# Patient Record
Sex: Male | Born: 1935 | Race: White | Hispanic: No | Smoking: Never smoker
Health system: Southern US, Community
[De-identification: ages and names within clinical notes are randomized; demographics above are authoritative.]

## PROBLEM LIST (undated history)

## (undated) DIAGNOSIS — H353130 Nonexudative age-related macular degeneration, bilateral, stage unspecified: Secondary | ICD-10-CM

## (undated) DIAGNOSIS — E119 Type 2 diabetes mellitus without complications: Secondary | ICD-10-CM

## (undated) DIAGNOSIS — I10 Essential (primary) hypertension: Secondary | ICD-10-CM

## (undated) DIAGNOSIS — F0390 Unspecified dementia without behavioral disturbance: Secondary | ICD-10-CM

## (undated) DIAGNOSIS — C61 Malignant neoplasm of prostate: Secondary | ICD-10-CM

## (undated) HISTORY — PX: JOINT REPLACEMENT: SHX530

## (undated) HISTORY — PX: CATARACT EXTRACTION W/ INTRAOCULAR LENS  IMPLANT, BILATERAL: SHX1307

---

## 1989-07-31 HISTORY — PX: TOTAL SHOULDER ARTHROPLASTY: SHX126

## 1998-09-25 ENCOUNTER — Ambulatory Visit: Admission: RE | Admit: 1998-09-25 | Discharge: 1998-09-25 | Payer: Self-pay | Admitting: Orthopedic Surgery

## 2001-05-26 ENCOUNTER — Other Ambulatory Visit: Admission: RE | Admit: 2001-05-26 | Discharge: 2001-05-26 | Payer: Self-pay | Admitting: Urology

## 2001-05-26 ENCOUNTER — Encounter (INDEPENDENT_AMBULATORY_CARE_PROVIDER_SITE_OTHER): Payer: Self-pay | Admitting: Specialist

## 2001-06-06 ENCOUNTER — Ambulatory Visit: Admission: RE | Admit: 2001-06-06 | Discharge: 2001-09-04 | Payer: Self-pay | Admitting: *Deleted

## 2001-06-08 ENCOUNTER — Encounter: Admission: RE | Admit: 2001-06-08 | Discharge: 2001-06-08 | Payer: Self-pay | Admitting: Urology

## 2001-06-08 ENCOUNTER — Encounter: Payer: Self-pay | Admitting: Urology

## 2003-03-20 ENCOUNTER — Ambulatory Visit (HOSPITAL_BASED_OUTPATIENT_CLINIC_OR_DEPARTMENT_OTHER): Admission: RE | Admit: 2003-03-20 | Discharge: 2003-03-20 | Payer: Self-pay | Admitting: Urology

## 2003-03-20 ENCOUNTER — Encounter (INDEPENDENT_AMBULATORY_CARE_PROVIDER_SITE_OTHER): Payer: Self-pay

## 2009-04-10 ENCOUNTER — Encounter (INDEPENDENT_AMBULATORY_CARE_PROVIDER_SITE_OTHER): Payer: Self-pay | Admitting: General Surgery

## 2009-04-10 ENCOUNTER — Ambulatory Visit (HOSPITAL_COMMUNITY): Admission: RE | Admit: 2009-04-10 | Discharge: 2009-04-11 | Payer: Self-pay | Admitting: General Surgery

## 2011-03-10 LAB — COMPREHENSIVE METABOLIC PANEL
ALT: 18 U/L (ref 0–53)
AST: 24 U/L (ref 0–37)
Albumin: 3.8 g/dL (ref 3.5–5.2)
Alkaline Phosphatase: 59 U/L (ref 39–117)
BUN: 18 mg/dL (ref 6–23)
CO2: 30 mEq/L (ref 19–32)
Calcium: 9.3 mg/dL (ref 8.4–10.5)
Chloride: 103 mEq/L (ref 96–112)
Creatinine, Ser: 1.09 mg/dL (ref 0.4–1.5)
GFR calc Af Amer: >60 mL/min (ref >60)
GFR calc non Af Amer: >60 mL/min (ref >60)
Glucose, Bld: 123 mg/dL — ABNORMAL HIGH (ref 70–99)
Potassium: 3.3 mEq/L — ABNORMAL LOW (ref 3.5–5.1)
Sodium: 139 mEq/L (ref 135–145)
Total Bilirubin: 0.8 mg/dL (ref 0.3–1.2)
Total Protein: 6.3 g/dL (ref 6.0–8.3)

## 2011-03-10 LAB — DIFFERENTIAL
Basophils Absolute: 0 10*3/uL (ref 0.0–0.1)
Basophils Relative: 0 % (ref 0–1)
Eosinophils Absolute: 0.1 10*3/uL (ref 0.0–0.7)
Eosinophils Relative: 1 % (ref 0–5)
Lymphocytes Relative: 17 % (ref 12–46)
Lymphs Abs: 1.6 10*3/uL (ref 0.7–4.0)
Monocytes Absolute: 0.5 10*3/uL (ref 0.1–1.0)
Monocytes Relative: 6 % (ref 3–12)
Neutro Abs: 7 10*3/uL (ref 1.7–7.7)
Neutrophils Relative %: 76 % (ref 43–77)

## 2011-03-10 LAB — CBC
HCT: 40.9 % (ref 39.0–52.0)
Hemoglobin: 14.4 g/dL (ref 13.0–17.0)
MCHC: 35.2 g/dL (ref 30.0–36.0)
MCV: 92.2 fL (ref 78.0–100.0)
Platelets: 139 10*3/uL — ABNORMAL LOW (ref 150–400)
RBC: 4.44 MIL/uL (ref 4.22–5.81)
RDW: 13.6 % (ref 11.5–15.5)
WBC: 9.3 10*3/uL (ref 4.0–10.5)

## 2011-04-14 NOTE — Op Note (Signed)
NAME:  Fred Morris, Fred Morris                  ACCOUNT NO.:  [id number]   MEDICAL RECORD NO.:  [id number]          PATIENT TYPE:  AMB   LOCATION:  DAY                          FACILITY:  [facility]   PHYSICIAN:  Anselm Pancoast. Weatherly, M.D.DATE OF BIRTH:  Jul 17, 1918   DATE OF PROCEDURE:  04/10/2009  DATE OF DISCHARGE:                               OPERATIVE REPORT   PREOPERATIVE DIAGNOSIS:  Right inguinal hernia   OPERATION:  Right inguinal herniorrhaphy with mesh.   ANESTHESIA:  LOA with local sedation.   SURGEON:  Anselm Pancoast. Zachery Dakins, MD.   ASSISTANT:  Nurse.   HISTORY:  Fred Morris is a 77 year old Caucasian male, , who I first saw  him back in January when he was referred by Dr. Jacky Kindle for a mildly  symptomatic right inguinal hernia.  The patient, because of his age 77 and  etc., did not definitely want to proceed with herniorrhaphy.  He was  interested in getting a truss, which he did, and he has had a previous  problem of cancer of the prostate with radiation seeds, but he is not  having any definite problems with voiding.  He does have nocturia 2 or 3  times at night, but says his stream comes easily.  I saw him about 3  months later at which time his hernia was significantly larger and he  said that the truss had not worked and he wanted to proceed on with a  herniorrhaphy.  I talked with Dr. Jacky Kindle, his medical doctor, and he  said that the patient had a flex-sigmoid years ago, but that he had  never had any blood in his stool and I did not find any either, and he  thought age 77 that it was not necessary to do any preoperative  evaluation except for a chest x-ray and EKG, which he has had recently.  So he is here for the planned herniorrhaphy and we will plan on placing  a mesh.  The patient was taken to the operative suite.  Dr. Rica Mast  recommended that we do an LOA instead of trying to do it with local and  sedation, and I thought that would be fine.  An LOA tube was placed, the  groin  was clipped, and then prepped with Betadine solution.  He had a  gram of Ancef preoperatively.  His prep, I think, was the Hibiclens  since they questioned whether he might be allergic to iodine, and the  patient was draped in a sterile manner.  An ilioinguinal nerve block was  placed with about 10 mL of 0.25 Marcaine with adrenaline with a blunted  22-inch needle at the anterior iliac crest, and then the inguinal  incision area was infiltrated with the same solution using a 25-gauge  needle.  Sharp dissection down through the skin and subcutaneous tissue.  The timeout had been completed, the patient had been marked  preoperatively on the right side, and these 2 superficial veins were  clamped, divided, and ligated with a 5-0 Vicryl, and then the external  oblique aponeurosis was opened through the external ring.  The cord  structures were elevated.  It looks like this was a fairly large  indirect hernia.  He has got a weakness in the floor, but the actual  bulge is an indirect hernia, and I elevated the cord structures, kind of  separated the cremaster fibers and etc., and then opened the cord  structures and dissected out a fairly broad-based hernia sac that was  about 4 or 5 inches in length.  A high sacral ligation under direct  vision was done with 0 Surgilon and the second suture just distal.  We  had dissected out the vas and artery and care not to compromise any of  the veins.  I anesthetized the peritoneum then before removing the  hernia sac and then retracted up the preperitoneal space nicely and then  kind of reapproximated the cremaster fibers with some interrupted 3-0  Vicryl.  He has got a definite weakness in the floor but not really a  definite hernia, and I elected to repair this with a kind of a modified  Shouldice starting at the symphysis pubis, sutured the lateral edge of  the conjoin to the inguinal ligament and running the second layer back,  tying the 2 tails each  time and the 2 ends together.  Next, a piece of  Prolene mesh shaped like a sail slit laterally was used to reinforce the  floor.  The ilioinguinal nerve is with the cord structures and I sutured  this to the shelving over the inguinal ligament inferior with a running  2-0 Prolene.  The 2 tails were sutured together lateral and making sure  that we did not compromise the new created internal ring by checking the  mesh and opening it.  The superior flap was sutured down with  interrupted sutures of 2-0 Prolene and the mesh is lying flat but not  tight.  The Penrose was removed.  Then, the external oblique was closed  with a running 2-0 Vicryl.  Scarpa's fascia was closed with interrupted  3-0 Vicryl, and then a 4-0 Dexon subcuticular with Benzoin and Steri-  Strips on the skin.  The testicle was in its normal position.  The  patient awakened and was sent to the recovery room in stable postop  condition.  We are going to keep him overnight and hopefully he will  able to be released in the morning and hopefully he will not have to  require catheterization.      Anselm Pancoast. Zachery Dakins, M.D.  Electronically Signed     WJW/MEDQ  D:  04/10/2009  T:  04/10/2009  Job:  [id number]   cc:   [name], M.D.  Fax: [phone]

## 2011-04-17 NOTE — Op Note (Signed)
NAME:  Fred Morris, Fred Morris                            ACCOUNT NO.:  [id number]   MEDICAL RECORD NO.:  [id number]                   PATIENT TYPE:  AMB   LOCATION:  [facility]                                 FACILITY:  [facility]   PHYSICIAN:  Jamison Neighbor, M.D.               DATE OF BIRTH:  1918-09-16   DATE OF PROCEDURE:  03/20/2003  DATE OF DISCHARGE:                                 OPERATIVE REPORT   SERVICE:  Urology.   PREOPERATIVE DIAGNOSES:  Bladder mass, rule out transitional cell carcinoma  of the bladder.   POSTOPERATIVE DIAGNOSES:  Bladder mass, rule out transitional cell carcinoma  of the bladder.   PROCEDURE:  Cystoscopy and TURBT.   SURGEON:  Jamison Neighbor, M.D.   ANESTHESIA:  General.   COMPLICATIONS:  None.   DRAINS:  None.   BRIEF HISTORY:  This 77 year old male is status post radiation therapy for  carcinoma of the prostate. The patient has developed gross hematuria. His  upper tract studies were unremarkable. Cystoscopy demonstrated an  inflammatory appearing lesion on the trigone of the right hand side. The  patient is now to undergo transurethral resection of this mass for biopsy  purposes as well as to control bleeding. The patient understands the risks  and benefits of the procedure and gave full and informed consent.   DESCRIPTION OF PROCEDURE:  After successful induction of general anesthesia,  the patient was placed in the dorsal lithotomy position, prepped with  Betadine and draped in the usual sterile fashion. Cystoscopy was performed,  the urethra was visualized in its entirety and found to be normal. Beyond  the verumontanum, there was some prostatic enlargement and the prostate was  noted to be a little bit rigid following the radiation therapy. The left  side of the prostate appeared to be somewhat larger than the right. The  cystoscope was inserted into the bladder, the bladder is markedly  trabeculated but the patient does not have significant  bladder outlet  symptoms at this time and for that reason it was felt that TURP is not  appropriate. The lesion in question was identified on the right hand side of  the trigone, left hand side appeared normal. Because of the lesion which is  very inflammatory in nature as well as the trabeculation, it was difficult  to ascertain the location of the ureters. The cystoscope was removed, the  resectoscope was inserted, the area was resected and fulgurated in order  ensure that hemostasis had been obtained. The chips were taken from the  bladder and will be sent for permanent specimen. The patient tolerated the  procedure well and was taken to the recovery room in good condition. The  patient will go home without a catheter unless he has difficulty voiding.  The patient tolerated the procedure well and was taken to the recovery room  in good condition to be sent  home on Septra DS and Darvocet-N 100 and return  to see me in 1-2 weeks time.                                              Jamison Neighbor, M.D.   RJE/MEDQ  D:  03/20/2003  T:  03/20/2003  Job:  [id number]

## 2011-05-07 ENCOUNTER — Other Ambulatory Visit: Payer: Self-pay | Admitting: Dermatology

## 2012-03-17 ENCOUNTER — Other Ambulatory Visit: Payer: Self-pay | Admitting: Dermatology

## 2012-09-21 ENCOUNTER — Other Ambulatory Visit: Payer: Self-pay | Admitting: Dermatology

## 2013-08-16 ENCOUNTER — Other Ambulatory Visit: Payer: Self-pay | Admitting: Dermatology

## 2013-12-14 ENCOUNTER — Other Ambulatory Visit: Payer: Self-pay | Admitting: Dermatology

## 2013-12-29 ENCOUNTER — Other Ambulatory Visit: Payer: Self-pay | Admitting: Dermatology

## 2014-04-20 ENCOUNTER — Other Ambulatory Visit: Payer: Self-pay | Admitting: Dermatology

## 2014-10-17 ENCOUNTER — Other Ambulatory Visit: Payer: Self-pay | Admitting: Dermatology

## 2015-02-08 ENCOUNTER — Encounter (HOSPITAL_COMMUNITY): Payer: Self-pay

## 2015-02-08 ENCOUNTER — Inpatient Hospital Stay (HOSPITAL_COMMUNITY)
Admission: EM | Admit: 2015-02-08 | Discharge: 2015-02-10 | DRG: 310 | Disposition: A | Discharge status: Home Health | Payer: Medicare Other | Attending: Internal Medicine | Admitting: Internal Medicine

## 2015-02-08 ENCOUNTER — Emergency Department (HOSPITAL_COMMUNITY): Payer: Medicare Other

## 2015-02-08 DIAGNOSIS — I10 Essential (primary) hypertension: Secondary | ICD-10-CM | POA: Diagnosis present

## 2015-02-08 DIAGNOSIS — H3530 Unspecified macular degeneration: Secondary | ICD-10-CM | POA: Diagnosis present

## 2015-02-08 DIAGNOSIS — I4891 Unspecified atrial fibrillation: Principal | ICD-10-CM | POA: Insufficient documentation

## 2015-02-08 DIAGNOSIS — F329 Major depressive disorder, single episode, unspecified: Secondary | ICD-10-CM | POA: Diagnosis present

## 2015-02-08 DIAGNOSIS — E119 Type 2 diabetes mellitus without complications: Secondary | ICD-10-CM | POA: Diagnosis present

## 2015-02-08 DIAGNOSIS — R55 Syncope and collapse: Secondary | ICD-10-CM | POA: Diagnosis present

## 2015-02-08 DIAGNOSIS — Z8546 Personal history of malignant neoplasm of prostate: Secondary | ICD-10-CM | POA: Diagnosis not present

## 2015-02-08 DIAGNOSIS — Z923 Personal history of irradiation: Secondary | ICD-10-CM

## 2015-02-08 DIAGNOSIS — F0390 Unspecified dementia without behavioral disturbance: Secondary | ICD-10-CM | POA: Diagnosis present

## 2015-02-08 DIAGNOSIS — R109 Unspecified abdominal pain: Secondary | ICD-10-CM

## 2015-02-08 HISTORY — DX: Malignant neoplasm of prostate: C61

## 2015-02-08 HISTORY — DX: Unspecified dementia, unspecified severity, without behavioral disturbance, psychotic disturbance, mood disturbance, and anxiety: F03.90

## 2015-02-08 HISTORY — DX: Essential (primary) hypertension: I10

## 2015-02-08 HISTORY — DX: Type 2 diabetes mellitus without complications: E11.9

## 2015-02-08 HISTORY — DX: Nonexudative age-related macular degeneration, bilateral, stage unspecified: H35.3130

## 2015-02-08 LAB — I-STAT TROPONIN, ED: Troponin i, poc: 0 ng/mL (ref 0.00–0.08)

## 2015-02-08 LAB — BASIC METABOLIC PANEL
ANION GAP: 7 (ref 5–15)
BUN: 23 mg/dL (ref 6–23)
CHLORIDE: 105 mmol/L (ref 96–112)
CO2: 27 mmol/L (ref 19–32)
Calcium: 9.1 mg/dL (ref 8.4–10.5)
Creatinine, Ser: 0.99 mg/dL (ref 0.50–1.35)
GFR calc Af Amer: 79 mL/min — ABNORMAL LOW (ref >90)
GFR calc non Af Amer: 68 mL/min — ABNORMAL LOW (ref >90)
Glucose, Bld: 119 mg/dL — ABNORMAL HIGH (ref 70–99)
Potassium: 3.5 mmol/L (ref 3.5–5.1)
SODIUM: 139 mmol/L (ref 135–145)

## 2015-02-08 LAB — URINALYSIS, ROUTINE W REFLEX MICROSCOPIC
BILIRUBIN URINE: NEGATIVE
GLUCOSE, UA: NEGATIVE mg/dL
HGB URINE DIPSTICK: NEGATIVE
Ketones, ur: 15 mg/dL — AB
Leukocytes, UA: NEGATIVE
Nitrite: NEGATIVE
PH: 6 (ref 5.0–8.0)
PROTEIN: NEGATIVE mg/dL
Specific Gravity, Urine: 1.018 (ref 1.005–1.030)
Urobilinogen, UA: 0.2 mg/dL (ref 0.0–1.0)

## 2015-02-08 LAB — I-STAT CHEM 8, ED
BUN: 26 mg/dL — AB (ref 6–23)
CHLORIDE: 102 mmol/L (ref 96–112)
Calcium, Ion: 1.19 mmol/L (ref 1.13–1.30)
Creatinine, Ser: 0.9 mg/dL (ref 0.50–1.35)
GLUCOSE: 117 mg/dL — AB (ref 70–99)
HCT: 43 % (ref 39.0–52.0)
HEMOGLOBIN: 14.6 g/dL (ref 13.0–17.0)
Potassium: 3.4 mmol/L — ABNORMAL LOW (ref 3.5–5.1)
Sodium: 141 mmol/L (ref 135–145)
TCO2: 22 mmol/L (ref 0–100)

## 2015-02-08 LAB — CBC
HCT: 40.7 % (ref 39.0–52.0)
Hemoglobin: 13.7 g/dL (ref 13.0–17.0)
MCH: 30.8 pg (ref 26.0–34.0)
MCHC: 33.7 g/dL (ref 30.0–36.0)
MCV: 91.5 fL (ref 78.0–100.0)
Platelets: 133 10*3/uL — ABNORMAL LOW (ref 150–400)
RBC: 4.45 MIL/uL (ref 4.22–5.81)
RDW: 13.6 % (ref 11.5–15.5)
WBC: 9.5 10*3/uL (ref 4.0–10.5)

## 2015-02-08 LAB — CK: Total CK: 165 U/L (ref 7–232)

## 2015-02-08 NOTE — ED Notes (Signed)
Pt presented to ED with his daughter. He stated that while eating breakfast,while walking to the bathroom he got dizzy and suffered LOC. Following this he reports feeling week "all over" and crawled into his den where he reports he sat for "a few hours". He reports hitting his head on the right side and his abdomen on his left side. He has a history of macular degeneration, HTN. He denies pain at this time. His extremities are equally strong; he has strong grips and equal dexterity.

## 2015-02-08 NOTE — ED Notes (Signed)
PT reports falling at home. Pt sts "I think I may have passed out for a good lil bit".  Bruising to left flank area and abrasion.  Pt also reports hitting head to the right temple.

## 2015-02-08 NOTE — ED Provider Notes (Signed)
CSN: [id number]     Arrival date & time 02/08/15  1739 History   First MD Initiated Contact with Patient 02/08/15 2035     Chief Complaint  Patient presents with  . Loss of Consciousness  . Fall   (Consider location/radiation/quality/duration/timing/severity/associated sxs/prior Treatment) HPI  Fred Morris is a 81 yo male presenting with report of syncopal episode and fall this morning.  He states woke up to start his day and had breakfast.  After breakfast he was walking to the bathroom, and suddenly felt light headed and thinks he passed out.  He thinks he laid in floor for a while and then crawled to another room and called his sister. His daughter she reports she called him around 10 am and he had not fallen at that time. The pt's sister called his daughter around 1 pm to report the fall.  She reports he had a fall earlier in the week where he fell and hit his left posterior ribs.  He currently denies any pain. He denies any recent illness including nausea, vomiting, diarrhea, fever.  He denies any chest pain or shortness of breath prior to the episode.   Past Medical History  Diagnosis Date  . Hypertension    History reviewed. No pertinent past surgical history. History reviewed. No pertinent family history. History  Substance Use Topics  . Smoking status: Not on file  . Smokeless tobacco: Not on file  . Alcohol Use: Not on file    Review of Systems  Constitutional: Negative for fever and chills.  HENT: Negative for sore throat.   Eyes: Negative for visual disturbance.  Respiratory: Negative for cough and shortness of breath.   Cardiovascular: Negative for chest pain and leg swelling.  Gastrointestinal: Negative for nausea, vomiting and diarrhea.  Genitourinary: Negative for dysuria.  Musculoskeletal: Negative for myalgias.  Skin: Negative for rash.  Neurological: Positive for syncope. Negative for weakness, numbness and headaches.      Allergies  Review of patient's  allergies indicates not on file.  Home Medications   Prior to Admission medications   Not on File   BP 146/84 mmHg  Pulse 76  Temp(Src) 98.1 F (36.7 C) (Oral)  Resp 16  SpO2 100% Physical Exam  Constitutional: He is oriented to person, place, and time. He appears well-developed and well-nourished. No distress.  HENT:  Head: Normocephalic and atraumatic.  Mouth/Throat: Oropharynx is clear and moist. No oropharyngeal exudate.  Eyes: Conjunctivae are normal.  Neck: Neck supple. No thyromegaly present.  Cardiovascular: Normal rate and intact distal pulses.  An irregular rhythm present. Exam reveals no gallop and no friction rub.   No murmur heard. Pulmonary/Chest: Effort normal and breath sounds normal. No respiratory distress. He has no wheezes. He has no rales. He exhibits no tenderness.  Abdominal: Soft. There is no tenderness.  Musculoskeletal: He exhibits no tenderness.       Arms: Mild bruising over left flank and healed 1 cm lac over left posterior ribs.  Lymphadenopathy:    He has no cervical adenopathy.  Neurological: He is alert and oriented to person, place, and time. No cranial nerve deficit. Coordination normal.  Skin: Skin is warm and dry. No rash noted. He is not diaphoretic.  Psychiatric: He has a normal mood and affect.  Nursing note and vitals reviewed.   ED Course  Procedures (including critical care time) Labs Review Labs Reviewed  CBC - Abnormal; Notable for the following:    Platelets 133 (*)  All other components within normal limits  BASIC METABOLIC PANEL - Abnormal; Notable for the following:    Glucose, Bld 119 (*)    GFR calc non Af Amer 68 (*)    GFR calc Af Amer 79 (*)    All other components within normal limits  I-STAT CHEM 8, ED - Abnormal; Notable for the following:    Potassium 3.4 (*)    BUN 26 (*)    Glucose, Bld 117 (*)    All other components within normal limits  URINALYSIS, ROUTINE W REFLEX MICROSCOPIC  CK  I-STAT TROPOININ,  ED    Imaging Review Dg Chest 2 View  02/08/2015   CLINICAL DATA:  81 year old male post fall and loss of consciousness.  EXAM: CHEST  2 VIEW  COMPARISON:  None.  FINDINGS: Heart is at the upper limits of normal in size. There is mild hyperinflation. Pulmonary vasculature is normal. Minimal atelectasis or scarring at the costophrenic angles. No consolidation, pleural effusion, or pneumothorax. No acute osseous abnormalities are seen.  IMPRESSION: 1.  No acute pulmonary process. 2. Mild hyperinflation and scarring or atelectasis at the costophrenic angles.   Electronically Signed   By: Jeb Levering M.D.   On: 02/08/2015 21:53     EKG Interpretation   Date/Time:  Friday February 08 2015 17:51:30 EST Ventricular Rate:  83 PR Interval:    QRS Duration: 82 QT Interval:  388 QTC Calculation: 455 R Axis:   69 Text Interpretation:  Atrial fibrillation Abnormal ECG Confirmed by BEATON   MD, ROBERT ([id number]) on 02/08/2015 9:54:40 PM      MDM   Final diagnoses:  Syncope, unspecified syncope type   81 yo male with syncopal episode. Hie neuro exam is normal. Case discussed with Dr. Audie Pinto. CBC, CMP, troponin, CK, UA, CXR, Head CT and EKG.   Labs and imaging reviewed, no significant abnormalities noted.  EKG shows new rate controlled a-fib.   11:45 PM: Consulted Dr. Jabier Mutton (hospitalist) regarding pt's status. Pt accepted to telemetry floor. The patient appears reasonably stabilized for admission considering the current resources, flow, and capabilities available in the ED at this time, and I doubt any further screening and/or treatment in the ED prior to admission.   Filed Vitals:   02/08/15 1758 02/08/15 2033  BP: 113/56 146/84  Pulse: 85 76  Temp: 98.4 F (36.9 C) 98.1 F (36.7 C)  TempSrc: Oral Oral  Resp: 16 16  SpO2: 100% 100%   Meds given in ED:  Medications - No data to display  New Prescriptions   No medications on file       Britt Bottom, NP 02/09/15  [place], MD 02/10/15 1407

## 2015-02-09 ENCOUNTER — Encounter (HOSPITAL_COMMUNITY): Payer: Self-pay | Admitting: Internal Medicine

## 2015-02-09 ENCOUNTER — Observation Stay (HOSPITAL_COMMUNITY): Payer: Medicare Other

## 2015-02-09 DIAGNOSIS — R109 Unspecified abdominal pain: Secondary | ICD-10-CM | POA: Diagnosis not present

## 2015-02-09 DIAGNOSIS — R55 Syncope and collapse: Secondary | ICD-10-CM

## 2015-02-09 DIAGNOSIS — I4891 Unspecified atrial fibrillation: Principal | ICD-10-CM

## 2015-02-09 LAB — CBC WITH DIFFERENTIAL/PLATELET
Basophils Absolute: 0 10*3/uL (ref 0.0–0.1)
Basophils Relative: 0 % (ref 0–1)
EOS PCT: 1 % (ref 0–5)
Eosinophils Absolute: 0.1 10*3/uL (ref 0.0–0.7)
HCT: 42.5 % (ref 39.0–52.0)
Hemoglobin: 14.2 g/dL (ref 13.0–17.0)
Lymphocytes Relative: 17 % (ref 12–46)
Lymphs Abs: 1.9 10*3/uL (ref 0.7–4.0)
MCH: 31 pg (ref 26.0–34.0)
MCHC: 33.4 g/dL (ref 30.0–36.0)
MCV: 92.8 fL (ref 78.0–100.0)
MONO ABS: 0.9 10*3/uL (ref 0.1–1.0)
MONOS PCT: 8 % (ref 3–12)
NEUTROS PCT: 74 % (ref 43–77)
Neutro Abs: 8.2 10*3/uL — ABNORMAL HIGH (ref 1.7–7.7)
PLATELETS: 126 10*3/uL — AB (ref 150–400)
RBC: 4.58 MIL/uL (ref 4.22–5.81)
RDW: 13.6 % (ref 11.5–15.5)
WBC: 11.2 10*3/uL — ABNORMAL HIGH (ref 4.0–10.5)

## 2015-02-09 LAB — COMPREHENSIVE METABOLIC PANEL
ALBUMIN: 3.5 g/dL (ref 3.5–5.2)
ALK PHOS: 64 U/L (ref 39–117)
ALT: 18 U/L (ref 0–53)
AST: 30 U/L (ref 0–37)
Anion gap: 10 (ref 5–15)
BILIRUBIN TOTAL: 1.2 mg/dL (ref 0.3–1.2)
BUN: 19 mg/dL (ref 6–23)
CALCIUM: 8.9 mg/dL (ref 8.4–10.5)
CO2: 25 mmol/L (ref 19–32)
CREATININE: 0.9 mg/dL (ref 0.50–1.35)
Chloride: 105 mmol/L (ref 96–112)
GFR calc Af Amer: 82 mL/min — ABNORMAL LOW (ref >90)
GFR calc non Af Amer: 71 mL/min — ABNORMAL LOW (ref >90)
GLUCOSE: 109 mg/dL — AB (ref 70–99)
Potassium: 3.5 mmol/L (ref 3.5–5.1)
Sodium: 140 mmol/L (ref 135–145)
TOTAL PROTEIN: 6.7 g/dL (ref 6.0–8.3)

## 2015-02-09 LAB — TROPONIN I: Troponin I: <0.03 ng/mL (ref <0.031)

## 2015-02-09 LAB — TSH: TSH: 3.176 u[IU]/mL (ref 0.350–4.500)

## 2015-02-09 MED ORDER — SODIUM CHLORIDE 0.9 % IJ SOLN
3.0000 mL | Freq: Two times a day (BID) | INTRAMUSCULAR | Status: DC
  Administered 2015-02-09 – 2015-02-10 (×2): 3 mL via INTRAVENOUS

## 2015-02-09 MED ORDER — ONDANSETRON HCL 4 MG PO TABS: 4.0000 mg | ORAL_TABLET | Freq: Four times a day (QID) | ORAL | Status: DC | PRN

## 2015-02-09 MED ORDER — ACETAMINOPHEN 325 MG PO TABS
650.0000 mg | ORAL_TABLET | Freq: Four times a day (QID) | ORAL | Status: DC | PRN
  Administered 2015-02-09 – 2015-02-10 (×4): 650 mg via ORAL
  Filled 2015-02-09 (×4): qty 2

## 2015-02-09 MED ORDER — ACETAMINOPHEN 650 MG RE SUPP: 650.0000 mg | Freq: Four times a day (QID) | RECTAL | Status: DC | PRN

## 2015-02-09 MED ORDER — ASPIRIN 325 MG PO TABS
325.0000 mg | ORAL_TABLET | Freq: Every day | ORAL | Status: DC
  Administered 2015-02-09 – 2015-02-10 (×2): 325 mg via ORAL
  Filled 2015-02-09 (×2): qty 1

## 2015-02-09 MED ORDER — METOPROLOL TARTRATE 12.5 MG HALF TABLET
12.5000 mg | ORAL_TABLET | Freq: Two times a day (BID) | ORAL | Status: DC
Start: 2015-02-09 — End: 2015-02-09
  Administered 2015-02-09: 12.5 mg via ORAL
  Filled 2015-02-09 (×2): qty 1

## 2015-02-09 MED ORDER — CITALOPRAM HYDROBROMIDE 20 MG PO TABS
20.0000 mg | ORAL_TABLET | Freq: Every day | ORAL | Status: DC
  Administered 2015-02-09 (×2): 20 mg via ORAL
  Filled 2015-02-09 (×3): qty 1

## 2015-02-09 MED ORDER — METOPROLOL TARTRATE 12.5 MG HALF TABLET
12.5000 mg | ORAL_TABLET | Freq: Two times a day (BID) | ORAL | Status: DC
  Administered 2015-02-10: 12.5 mg via ORAL
  Filled 2015-02-09 (×2): qty 1

## 2015-02-09 MED ORDER — SODIUM CHLORIDE 0.9 % IJ SOLN
3.0000 mL | Freq: Two times a day (BID) | INTRAMUSCULAR | Status: DC
  Administered 2015-02-09: 3 mL via INTRAVENOUS

## 2015-02-09 MED ORDER — AMLODIPINE BESYLATE 10 MG PO TABS
10.0000 mg | ORAL_TABLET | Freq: Every morning | ORAL | Status: DC
  Filled 2015-02-09: qty 1

## 2015-02-09 MED ORDER — ONDANSETRON HCL 4 MG/2ML IJ SOLN: 4.0000 mg | Freq: Four times a day (QID) | INTRAMUSCULAR | Status: DC | PRN

## 2015-02-09 NOTE — H&P (Signed)
[facility] History and Physical  CORDELL Morris PXT:[id number] DOB: Oct 12, 1918 DOA: 02/08/2015  Referring physician: ER physician. PCP: No primary care provider on file. Dr. Reynaldo Morris.  Chief Complaint: Loss of consciousness.  HPI: Fred Morris is a 81 y.o. male with history of hypertension, depression, prostate cancer treated with radiation was brought to the ER to patient had an episode of loss of consciousness. Patient states that he was walking to the bathroom after having breakfast when he felt dizzy and fell and lost consciousness. He hurt his left flank area. Patient stated that he does not know how long he lost consciousness for about after regaining consciousness he crawled back to the den. He called his sister who called his daughter and his daughter came home and checked on him. Patient was initially reluctant to come to the hospital. Patient denies any chest pain palpitations shortness of breath nausea vomiting headache visual symptoms and was found to be nonfocal. EKG was showing atrial fibrillation with controlled rate. Patient was not orthostatic. On exam patient has left flank bruise with tenderness. CT head did not show anything acute. Patient has been admitted for syncope and new onset atrial fibrillation.    Review of Systems: As presented in the history of presenting illness, rest negative.  Past Medical History  Diagnosis Date  . Hypertension   . Cancer    History reviewed. No pertinent past surgical history. Social History:  reports that he has never smoked. He does not have any smokeless tobacco history on file. He reports that he does not drink alcohol. His drug history is not on file. Where does patient live home. Can patient participate in ADLs? yes. Not on File  Family History:  Family History  Problem Relation Age of Onset  . CAD Brother   . Diabetes Mellitus II Neg Hx       Prior to Admission medications   Medication Sig Start Date End Date Taking?  Authorizing Provider  amLODipine (NORVASC) 10 MG tablet Take 10 mg by mouth every morning.   Yes Historical Provider, MD  citalopram (CELEXA) 20 MG tablet Take 20 mg by mouth at bedtime.   Yes Historical Provider, MD  Multiple Vitamins-Minerals (PRESERVISION AREDS 2) CAPS Take 1 capsule by mouth 2 (two) times daily.   Yes Historical Provider, MD  Probiotic Product (PROBIOTIC DAILY) CAPS Take 1 capsule by mouth 2 (two) times daily.   Yes Historical Provider, MD    Physical Exam: Filed Vitals:   02/08/15 2145 02/08/15 2200 02/08/15 2300 02/08/15 2358  BP: 145/72 146/77 135/63 126/64  Pulse: 80 87 60 69  Temp:      TempSrc:      Resp:    19  SpO2: 99% 100% 100% 100%     General:  Moderately built and nourished.  Eyes: Anicteric no pallor.  ENT: No discharge from ears eyes nose and mouth.  Neck: No mass felt.  Cardiovascular: S1-S2 heard.  Respiratory: No rhonchi or crepitations.  Abdomen: Soft nontender bowel sounds present.  Skin: Left flank ecchymosis.  Musculoskeletal: No edema.  Psychiatric: Appears normal.  Neurologic: Alert awake oriented to time place and person. Moves all extremities.  Labs on Admission:  Basic Metabolic Panel:  Recent Labs Lab 02/08/15 1756 02/08/15 1808  NA 139 141  K 3.5 3.4*  CL 105 102  CO2 27  --   GLUCOSE 119* 117*  BUN 23 26*  CREATININE 0.99 0.90  CALCIUM 9.1  --    Liver Function  Tests: No results for input(s): AST, ALT, ALKPHOS, BILITOT, PROT, ALBUMIN in the last 168 hours. No results for input(s): LIPASE, AMYLASE in the last 168 hours. No results for input(s): AMMONIA in the last 168 hours. CBC:  Recent Labs Lab 02/08/15 1756 02/08/15 1808  WBC 9.5  --   HGB 13.7 14.6  HCT 40.7 43.0  MCV 91.5  --   PLT 133*  --    Cardiac Enzymes:  Recent Labs Lab 02/08/15 2210  CKTOTAL 165    BNP (last 3 results) No results for input(s): BNP in the last 8760 hours.  ProBNP (last 3 results) No results for input(s):  PROBNP in the last 8760 hours.  CBG: No results for input(s): GLUCAP in the last 168 hours.  Radiological Exams on Admission: Dg Chest 2 View  02/08/2015   CLINICAL DATA:  81 year old male post fall and loss of consciousness.  EXAM: CHEST  2 VIEW  COMPARISON:  None.  FINDINGS: Heart is at the upper limits of normal in size. There is mild hyperinflation. Pulmonary vasculature is normal. Minimal atelectasis or scarring at the costophrenic angles. No consolidation, pleural effusion, or pneumothorax. No acute osseous abnormalities are seen.  IMPRESSION: 1.  No acute pulmonary process. 2. Mild hyperinflation and scarring or atelectasis at the costophrenic angles.   Electronically Signed   By: Jeb Levering M.D.   On: 02/08/2015 21:53   Ct Head Wo Contrast  02/08/2015   CLINICAL DATA:  He stated that while eating breakfast,while walking to the bathroom he got dizzy and suffered LOC. Following this he reports feeling week "all over" and crawled into his den where he reports he sat for "a few hours". He reports hitting his head on the right side and his abdomen on his left side. He has a history of macular degeneration, HTN. He denies pain at this time  EXAM: CT HEAD WITHOUT CONTRAST  TECHNIQUE: Contiguous axial images were obtained from the base of the skull through the vertex without intravenous contrast.  COMPARISON:  None.  FINDINGS: Examination demonstrates mild age related atrophy. Ventricles and cisterns are otherwise within normal. There is chronic ischemic microvascular disease present. There is no mass, mass effect, shift of midline structures or acute hemorrhage. There is no evidence to suggest acute infarction. Possible tiny lacunar infarcts of the basal ganglia. Remaining bones are within normal. Minimal mucosal membrane thickening over the posterior sphenoid sinus.  IMPRESSION: No acute intracranial findings.  Chronic ischemic microvascular disease and mild age related atrophy.   Electronically  Signed   By: Marin Olp M.D.   On: 02/08/2015 22:00    EKG: Independently reviewed. Atrial fibrillation rate controlled.  Assessment/Plan Principal Problem:   Syncope Active Problems:   Atrial fibrillation, new onset   Left flank pain   1. Syncope - cause not clear, at this time we will closely monitor in telemetry for any arrhythmias. Check 2-D echo. Get physical therapy consult. Check 2-D echo. 2. Atrial fibrillation new onset - as per patient's daughter patient also had another fall a few days ago. Patient's chads 2 VASC score is 3 but since patient has had 2 falls over the last 1 week estimated discharge and any anticoagulants at this time. Patient also has been having left flank pain for which CT abdomen and in order to rule out hematoma. 3. Left flank pain - follow CT abdomen to make sure there is no hematoma. 4. History of depression - continue present medications. 5. History of prostate cancer  was treated with radiation.   DVT Prophylaxis SCDs for now.  Code Status: Full code.  Family Communication: Patient's daughter at the bedside.  Disposition Plan: Admit for observation.    Tequlia Gonsalves N. [facility] Pager [phone].  If 7PM-7AM, please contact night-coverage [email or web address] Password [facility] 02/09/2015, 12:38 AM

## 2015-02-09 NOTE — Care Management Note (Signed)
Date Additional Medicare IM given:  02/09/15  Additional Medicare IM given by:  Briyanna Billingham, RN, BSN, CCM   

## 2015-02-09 NOTE — Progress Notes (Signed)
[facility] PROGRESS NOTE  Fred Morris TGG:[id number] DOB: 1918/03/23 DOA: 02/08/2015  PCP: Geoffery Lyons, MD  Brief HPI: 81 year old Caucasian male with a past medical history of hypertension, prostate cancer, who was brought into the emergency department after an episode of syncope. Apparently he was walking to the bathroom after having breakfast when he felt dizzy and fell and lost consciousness. He was the brought into the emergency department. He had an EKG which showed atrial fibrillation which appears to be a new diagnosis for him. He was hospitalized for further management.  Past medical history:  Past Medical History  Diagnosis Date  . Hypertension   . Prostate cancer     "had radiation"  . Age-related macular degeneration, dry, both eyes   . Dementia     "probably; due to his age" (02/09/2015)  . Diabetes mellitus without complication     Consultants: None  Procedures:  2-D echocardiogram is pending  Antibiotics: None  Subjective: Patient feels well this morning. Still has some pain in the left flank area. Denies any chest pain. No shortness of breath.  Objective: Vital Signs  Filed Vitals:   02/08/15 2300 02/08/15 2358 02/09/15 0038 02/09/15 0703  BP: 135/63 126/64 141/64 111/54  Pulse: 60 69 87 79  Temp:   97.8 F (36.6 C) 98.1 F (36.7 C)  TempSrc:   Oral Oral  Resp:  19 18 18   Height:   5\' 10"  (1.778 m)   Weight:   63.9 kg (140 lb 14 oz)   SpO2: 100% 100% 100% 97%   No intake or output data in the 24 hours ending 02/09/15 1029 Filed Weights   02/09/15 0038  Weight: 63.9 kg (140 lb 14 oz)    General appearance: alert, cooperative, appears stated age and no distress Resp: clear to auscultation bilaterally Cardio: S1, S2 is irregularly irregular. No S3, S4. Systolic murmur over the precordium. No pedal edema. No rubs, or bruit. GI: Abdomen is soft. Non-distended. Bruising is noted in the left flank area, over which it is also tender.  Bowel sounds are present. No masses or organomegaly. Extremities: extremities normal, atraumatic, no cyanosis or edema Neurologic: Alert and oriented 3. Nonfocal deficits are noted.  Lab Results:  Basic Metabolic Panel:  Recent Labs Lab 02/08/15 1756 02/08/15 1808 02/09/15 0333  NA 139 141 140  K 3.5 3.4* 3.5  CL 105 102 105  CO2 27  --  25  GLUCOSE 119* 117* 109*  BUN 23 26* 19  CREATININE 0.99 0.90 0.90  CALCIUM 9.1  --  8.9   Liver Function Tests:  Recent Labs Lab 02/09/15 0333  AST 30  ALT 18  ALKPHOS 64  BILITOT 1.2  PROT 6.7  ALBUMIN 3.5   CBC:  Recent Labs Lab 02/08/15 1756 02/08/15 1808 02/09/15 0333  WBC 9.5  --  11.2*  NEUTROABS  --   --  8.2*  HGB 13.7 14.6 14.2  HCT 40.7 43.0 42.5  MCV 91.5  --  92.8  PLT 133*  --  126*   Cardiac Enzymes:  Recent Labs Lab 02/08/15 2210 02/09/15 0114  CKTOTAL 165  --   TROPONINI  --  <0.03    Studies/Results: Ct Abdomen Pelvis Wo Contrast  02/09/2015   CLINICAL DATA:  81 year old male with left flank pain. Fall after dizziness, loss of consciousness injuring left flank area. Evaluate for hematoma.  EXAM: CT ABDOMEN AND PELVIS WITHOUT CONTRAST  TECHNIQUE: Multidetector CT imaging of the abdomen and pelvis  was performed following the standard protocol without IV contrast.  COMPARISON:  None.  FINDINGS: Mildly displaced fractures of left posterior eleventh and twelfth ribs. Minimal pleural thickening at the left lung base, no pneumothorax. Cardiomegaly and coronary artery calcifications. No pleural effusion or parenchymal consolidation. Minimal reticular opacities anteriorly.  Subcutaneous soft tissue edema involving the left flank with thickening of the posterior lateral abdominal wall musculature. No focal hematoma.  Evaluation for intra-abdominal injury is limited given lack of contrast. Allowing for this, no evidence of acute traumatic injury to the spleen, pancreas, adrenal glands, liver, gallbladder, or  kidneys. There is an 11 mm hypodensity in the mid spleen. Nonobstructing calculus in the mid left kidney. Small parenchymal calcifications in the mid left kidney. No hydronephrosis. Mild left adrenal nodularity.  Small hiatal hernia. There are no dilated or thickened bowel loops. Diverticulosis without diverticulitis. No mesenteric hematoma. No free air, free fluid, or intra-abdominal fluid collection.  Normal caliber atherosclerotic abdominal aorta which is tortuous. No retroperitoneal free fluid.  In the pelvis the urinary bladder is physiologically distended. There are prostate calcifications. Fat in the left inguinal canal. No pelvic free fluid.  No pelvic fracture. Scoliotic curvature and multilevel degenerative change in the lumbar spine, no lumbar spine fracture.  IMPRESSION: 1. Mildly displaced fractures posterior left eleventh and twelfth ribs. Minimal left pleural thickening without large pleural effusion or pneumothorax. 2. Subcutaneous soft tissue edema of the left flank with adjacent thickening/soft tissue injury to the posterior abdominal/flank wall musculature. 3. No evidence of intra-abdominal/pelvic traumatic injury allowing for lack of contrast. 4. Round 11 mm hypodensity in the mid spleen. This does not appear traumatic and is likely an incidental benign finding. 5. Incidental findings of diverticulosis without diverticulitis, small hiatal hernia, and atherosclerosis.   Electronically Signed   By: Jeb Levering M.D.   On: 02/09/2015 03:45   Dg Chest 2 View  02/08/2015   CLINICAL DATA:  81 year old male post fall and loss of consciousness.  EXAM: CHEST  2 VIEW  COMPARISON:  None.  FINDINGS: Heart is at the upper limits of normal in size. There is mild hyperinflation. Pulmonary vasculature is normal. Minimal atelectasis or scarring at the costophrenic angles. No consolidation, pleural effusion, or pneumothorax. No acute osseous abnormalities are seen.  IMPRESSION: 1.  No acute pulmonary  process. 2. Mild hyperinflation and scarring or atelectasis at the costophrenic angles.   Electronically Signed   By: Jeb Levering M.D.   On: 02/08/2015 21:53   Ct Head Wo Contrast  02/08/2015   CLINICAL DATA:  He stated that while eating breakfast,while walking to the bathroom he got dizzy and suffered LOC. Following this he reports feeling week "all over" and crawled into his den where he reports he sat for "a few hours". He reports hitting his head on the right side and his abdomen on his left side. He has a history of macular degeneration, HTN. He denies pain at this time  EXAM: CT HEAD WITHOUT CONTRAST  TECHNIQUE: Contiguous axial images were obtained from the base of the skull through the vertex without intravenous contrast.  COMPARISON:  None.  FINDINGS: Examination demonstrates mild age related atrophy. Ventricles and cisterns are otherwise within normal. There is chronic ischemic microvascular disease present. There is no mass, mass effect, shift of midline structures or acute hemorrhage. There is no evidence to suggest acute infarction. Possible tiny lacunar infarcts of the basal ganglia. Remaining bones are within normal. Minimal mucosal membrane thickening over the posterior sphenoid sinus.  IMPRESSION: No acute intracranial findings.  Chronic ischemic microvascular disease and mild age related atrophy.   Electronically Signed   By: Marin Olp M.D.   On: 02/08/2015 22:00    Medications:  Scheduled: . aspirin  325 mg Oral Daily  . citalopram  20 mg Oral QHS  . metoprolol tartrate  12.5 mg Oral BID  . sodium chloride  3 mL Intravenous Q12H  . sodium chloride  3 mL Intravenous Q12H   Continuous:  FTD:[id number] **OR** acetaminophen, ondansetron **OR** ondansetron (ZOFRAN) IV  Assessment/Plan:  Principal Problem:   Syncope Active Problems:   Atrial fibrillation, new onset   Left flank pain    Syncope Possibly neurocardiogenic. EKG did show A. fib with there was no RVR.  PT and OT to evaluate. Unfortunately, no orthostatics were done. Echocardiogram is pending.  Atrial fibrillation new onset His rate is reasonably well controlled. Will initiate low-dose beta blocker. Continue to monitor on telemetry. Patient's chads 2 VASC score is 3. But patient has had 2 falls within the last 1 week. Discussed anticoagulation with the patient and his daughter. They seemed reluctant. I think it might be reasonable at this time to defer this decision to the outpatient setting when the patient can discuss this further with his primary care physician. Echocardiogram is still pending and if that is remarkably abnormal, cardiology input an in patient may be necessary. TSH is normal.   Left flank pain with bruising CT of the abdomen and pelvis did not show any intra-abdominal trauma. Rib fractures and soft tissue injury was noted. Pain control.  History of depression Continuing home medications  History of prostate cancer was treated with radiation. Stable  DVT Prophylaxis: SCDs    Code Status: Full code  Family Communication: Discussed with the patient and his daughter  Disposition Plan: Await PT and OT. Await echocardiogram.    LOS: 1 day   [facility] Pager [phone] 02/09/2015, 10:29 AM  If 7PM-7AM, please contact night-coverage at [email or web address], password [facility]

## 2015-02-09 NOTE — Progress Notes (Signed)
UR completed 

## 2015-02-09 NOTE — Evaluation (Signed)
Physical Therapy Evaluation Patient Details Name: Fred Morris MRN: [id number] DOB: [birth year] Today's Date: 02/09/2015   History of Present Illness  Patient is a 81 yo male admitted 02/08/15 following syncopal episode with fall, Lt flank pain.  Patient with Afib.  Had 2 falls week of admission.  PMH:   HTN, depression, prostate CA  Clinical Impression  Patient presents with problems listed below.  Will benefit from acute PT to maximize independence prior to discharge home with assist of family.  Recommended patient use RW at all times for ambulation for safety.  Recommend HHPT for continued therapy at discharge for balance/safety.    Follow Up Recommendations Home health PT;Supervision for mobility/OOB    Equipment Recommendations  3in1 (PT)    Recommendations for Other Services       Precautions / Restrictions Precautions Precautions: Fall Precaution Comments: Recent history of falls Restrictions Weight Bearing Restrictions: No      Mobility  Bed Mobility Overal bed mobility: Modified Independent             General bed mobility comments: Use of bed rail.  Instructed patient to roll to side first, then move to sitting.  Transfers Overall transfer level: Needs assistance Equipment used: Rolling walker (2 wheeled) Transfers: Sit to/from Stand Sit to Stand: Min guard         General transfer comment: Verbal cues for hand placement.    Ambulation/Gait Ambulation/Gait assistance: Min guard Ambulation Distance (Feet): 200 Feet Assistive device: Rolling walker (2 wheeled) Gait Pattern/deviations: Step-through pattern;Decreased stride length;Trunk flexed Gait velocity: Decreased Gait velocity interpretation: Below normal speed for age/gender General Gait Details: Verbal cues for safe use of RW.  Cues to keep feet inside RW - Rt foot hitting leg of RW.    Stairs            Wheelchair Mobility    Modified Rankin (Stroke Patients Only)       Balance                                              Pertinent Vitals/Pain Pain Assessment: 0-10 Pain Score: 3  Pain Location: Lt side/flank Pain Descriptors / Indicators: Sore;Tender Pain Intervention(s): Monitored during session;Repositioned    Home Living Family/patient expects to be discharged to:: Private residence Living Arrangements: Alone Available Help at Discharge: Family;Available 24 hours/day (Initially) Type of Home: House Home Access: Stairs to enter Entrance Stairs-Rails: Right Entrance Stairs-Number of Steps: 11 from garage, 3 at front Home Layout: One level Home Equipment: Walker - 2 wheels;Grab bars - toilet;Grab bars - tub/shower;Shower seat      Prior Function Level of Independence: Independent               Hand Dominance        Extremity/Trunk Assessment   Upper Extremity Assessment: Overall WFL for tasks assessed           Lower Extremity Assessment: Generalized weakness         Communication   Communication: No difficulties  Cognition Arousal/Alertness: Awake/alert Behavior During Therapy: WFL for tasks assessed/performed Overall Cognitive Status: Within Functional Limits for tasks assessed                      General Comments      Exercises        Assessment/Plan    PT  Assessment Patient needs continued PT services  PT Diagnosis Abnormality of gait;Generalized weakness;Acute pain   PT Problem List Decreased strength;Decreased balance;Decreased mobility;Decreased knowledge of use of DME;Pain  PT Treatment Interventions DME instruction;Gait training;Stair training;Functional mobility training;Therapeutic activities;Therapeutic exercise;Patient/family education   PT Goals (Current goals can be found in the Care Plan section) Acute Rehab PT Goals Patient Stated Goal: To go home PT Goal Formulation: With patient/family Time For Goal Achievement: 02/16/15 Potential to Achieve Goals: Good    Frequency Min  3X/week   Barriers to discharge Inaccessible home environment;Decreased caregiver support Patient with 11 steps to enter from basement.  Encouraged pt to allow family to let him get out of car near front door to go up only 3 steps. Then family can park car.  Patient lives alone.  Family to provide assist - 24 hour initially.    Co-evaluation               End of Session Equipment Utilized During Treatment: Gait belt Activity Tolerance: Patient tolerated treatment well Patient left: in bed;with call bell/phone within reach;with family/visitor present Nurse Communication: Mobility status    Functional Assessment Tool Used: Clinical judgement Functional Limitation: Mobility: Walking and moving around Mobility: Walking and Moving Around Current Status (I3704): At least 1 percent but less than 20 percent impaired, limited or restricted Mobility: Walking and Moving Around Goal Status [phone]): At least 1 percent but less than 20 percent impaired, limited or restricted    Time: 6945-0388 PT Time Calculation (min) (ACUTE ONLY): 27 min   Charges:   PT Evaluation $Initial PT Evaluation Tier I: 1 Procedure PT Treatments $Gait Training: 8-22 mins   PT G Codes:   PT G-Codes **NOT FOR INPATIENT CLASS** Functional Assessment Tool Used: Clinical judgement Functional Limitation: Mobility: Walking and moving around Mobility: Walking and Moving Around Current Status (E2800): At least 1 percent but less than 20 percent impaired, limited or restricted Mobility: Walking and Moving Around Goal Status [phone]): At least 1 percent but less than 20 percent impaired, limited or restricted    Despina Pole 02/09/2015, 2:38 PM Carita Pian. Sanjuana Kava, [place] Pager [phone]

## 2015-02-10 DIAGNOSIS — I4891 Unspecified atrial fibrillation: Secondary | ICD-10-CM

## 2015-02-10 DIAGNOSIS — R55 Syncope and collapse: Secondary | ICD-10-CM | POA: Diagnosis not present

## 2015-02-10 LAB — CBC
HCT: 37.8 % — ABNORMAL LOW (ref 39.0–52.0)
HEMOGLOBIN: 12.6 g/dL — AB (ref 13.0–17.0)
MCH: 30.6 pg (ref 26.0–34.0)
MCHC: 33.3 g/dL (ref 30.0–36.0)
MCV: 91.7 fL (ref 78.0–100.0)
PLATELETS: 122 10*3/uL — AB (ref 150–400)
RBC: 4.12 MIL/uL — AB (ref 4.22–5.81)
RDW: 13.8 % (ref 11.5–15.5)
WBC: 7.5 10*3/uL (ref 4.0–10.5)

## 2015-02-10 LAB — BASIC METABOLIC PANEL
ANION GAP: 5 (ref 5–15)
BUN: 24 mg/dL — ABNORMAL HIGH (ref 6–23)
CHLORIDE: 104 mmol/L (ref 96–112)
CO2: 28 mmol/L (ref 19–32)
CREATININE: 0.98 mg/dL (ref 0.50–1.35)
Calcium: 8.6 mg/dL (ref 8.4–10.5)
GFR calc Af Amer: 79 mL/min — ABNORMAL LOW (ref >90)
GFR calc non Af Amer: 68 mL/min — ABNORMAL LOW (ref >90)
Glucose, Bld: 101 mg/dL — ABNORMAL HIGH (ref 70–99)
POTASSIUM: 3.5 mmol/L (ref 3.5–5.1)
Sodium: 137 mmol/L (ref 135–145)

## 2015-02-10 MED ORDER — METOPROLOL TARTRATE 25 MG PO TABS: 12.5000 mg | ORAL_TABLET | Freq: Every day | ORAL | Status: AC

## 2015-02-10 MED ORDER — ASPIRIN EC 325 MG PO TBEC: 325.0000 mg | DELAYED_RELEASE_TABLET | Freq: Every day | ORAL | Status: AC

## 2015-02-10 NOTE — Care Management Note (Signed)
    Page 1 of 2   02/10/2015     2:31:10 PM CARE MANAGEMENT NOTE 02/10/2015  Patient:  Fred Morris, Fred Morris   Account Number:  [id number]  Date Initiated:  02/10/2015  Documentation initiated by:  [facility]  Subjective/Objective Assessment:   adm: episode of syncope     Action/Plan:   discharge planing   Anticipated DC Date:  02/10/2015   Anticipated DC Plan:  [place]  CM consult      [facility] Choice  HOME HEALTH   Choice offered to / List presented to:  C-1 Patient   DME arranged  3-N-1      DME agency  [place] arranged  [place]   Status of service:  Completed, signed off Medicare Important Message given?   (If response is "NO", the following Medicare IM given date fields will be blank) Date Medicare IM given:   Medicare IM given by:   Date Additional Medicare IM given:   Additional Medicare IM given by:    Discharge Disposition:  [place]  Per UR Regulation:    If discussed at Long Length of Stay Meetings, dates discussed:    Comments:  02/10/15 14:20 Cm spoke with pt's daughter, Fred Morris [phone] to offer choice of home health agency.  Fred Morris chooses [place] [facility] to render HHPT/OT.  Address verified with Fred Morris.  Fred Morris prefers [place] to contact her for scheduling.  CM called [facility] DME rep, Fred Morris to please deliver a 3n1 to the room prior to discharge.  Despite EPIC which lists "none" as insurance, Fred Morris sates her father has Medicare [employer] and [employer].  Referral faxed (per [facility]'s request) to [phone].  No other CM needs were communkicated.  Mariane Masters, BSN, CM [phone].

## 2015-02-10 NOTE — Progress Notes (Signed)
Utilization review completed.  P.J. Coyle Stordahl,RN,BSN Case Manager 

## 2015-02-10 NOTE — Discharge Instructions (Signed)
Atrial Fibrillation °Atrial fibrillation is a type of irregular heart rhythm (arrhythmia). During atrial fibrillation, the upper chambers of the heart (atria) quiver continuously in a chaotic pattern. This causes an irregular and often rapid heart rate.  °Atrial fibrillation is the result of the heart becoming overloaded with disorganized signals that tell it to beat. These signals are normally released one at a time by a part of the right atrium called the sinoatrial node. They then travel from the atria to the lower chambers of the heart (ventricles), causing the atria and ventricles to contract and pump blood as they pass. In atrial fibrillation, parts of the atria outside of the sinoatrial node also release these signals. This results in two problems. First, the atria receive so many signals that they do not have time to fully contract. Second, the ventricles, which can only receive one signal at a time, beat irregularly and out of rhythm with the atria.  °There are three types of atrial fibrillation:  °· Paroxysmal. Paroxysmal atrial fibrillation starts suddenly and stops on its own within a week. °· Persistent. Persistent atrial fibrillation lasts for more than a week. It may stop on its own or with treatment. °· Permanent. Permanent atrial fibrillation does not go away. Episodes of atrial fibrillation may lead to permanent atrial fibrillation. °Atrial fibrillation can prevent your heart from pumping blood normally. It increases your risk of stroke and can lead to heart failure.  °CAUSES  °· Heart conditions, including a heart attack, heart failure, coronary artery disease, and heart valve conditions.   °· Inflammation of the sac that surrounds the heart (pericarditis). °· Blockage of an artery in the lungs (pulmonary embolism). °· Pneumonia or other infections. °· Chronic lung disease. °· Thyroid problems, especially if the thyroid is overactive (hyperthyroidism). °· Caffeine, excessive alcohol use, and use  of some illegal drugs.   °· Use of some medicines, including certain decongestants and diet pills. °· Heart surgery.   °· Birth defects.   °Sometimes, no cause can be found. When this happens, the atrial fibrillation is called lone atrial fibrillation. The risk of complications from atrial fibrillation increases if you have lone atrial fibrillation and you are age 60 years or older. °RISK FACTORS °· Heart failure. °· Coronary artery disease. °· Diabetes mellitus.   °· High blood pressure (hypertension).   °· Obesity.   °· Other arrhythmias.   °· Increased age. °SIGNS AND SYMPTOMS  °· A feeling that your heart is beating rapidly or irregularly.   °· A feeling of discomfort or pain in your chest.   °· Shortness of breath.   °· Sudden light-headedness or weakness.   °· Getting tired easily when exercising.   °· Urinating more often than normal (mainly when atrial fibrillation first begins).   °In paroxysmal atrial fibrillation, symptoms may start and suddenly stop. °DIAGNOSIS  °Your health care provider may be able to detect atrial fibrillation when taking your pulse. Your health care provider may have you take a test called an ambulatory electrocardiogram (ECG). An ECG records your heartbeat patterns over a 24-hour period. You may also have other tests, such as: °· Transthoracic echocardiogram (TTE). During echocardiography, sound waves are used to evaluate how blood flows through your heart. °· Transesophageal echocardiogram (TEE). °· Stress test. There is more than one type of stress test. If a stress test is needed, ask your health care provider about which type is best for you. °· Chest X-ray exam. °· Blood tests. °· Computed tomography (CT). °TREATMENT  °Treatment may include: °· Treating any underlying conditions. For example, if you   have an overactive thyroid, treating the condition may correct atrial fibrillation.  Taking medicine. Medicines may be given to control a rapid heart rate or to prevent blood  clots, heart failure, or a stroke.  Having a procedure to correct the rhythm of the heart:  Electrical cardioversion. During electrical cardioversion, a controlled, low-energy shock is delivered to the heart through your skin. If you have chest pain, very low blood pressure, or sudden heart failure, this procedure may need to be done as an emergency.  Catheter ablation. During this procedure, heart tissues that send the signals that cause atrial fibrillation are destroyed.  Surgical ablation. During this surgery, thin lines of heart tissue that carry the abnormal signals are destroyed. This procedure can either be an open-heart surgery or a minimally invasive surgery. With the minimally invasive surgery, small cuts are made to access the heart instead of a large opening.  Pulmonary venous isolation. During this surgery, tissue around the veins that carry blood from the lungs (pulmonary veins) is destroyed. This tissue is thought to carry the abnormal signals. HOME CARE INSTRUCTIONS   Take medicines only as directed by your health care provider. Some medicines can make atrial fibrillation worse or recur.  If blood thinners were prescribed by your health care provider, take them exactly as directed. Too much blood-thinning medicine can cause bleeding. If you take too little, you will not have the needed protection against stroke and other problems.  Perform blood tests at home if directed by your health care provider. Perform blood tests exactly as directed.  Quit smoking if you smoke.  Do not drink alcohol.  Do not drink caffeinated beverages such as coffee, soda, and some teas. You may drink decaffeinated coffee, soda, or tea.   Maintain a healthy weight.Do not use diet pills unless your health care provider approves. They may make heart problems worse.   Follow diet instructions as directed by your health care provider.  Exercise regularly as directed by your health care  provider.  Keep all follow-up visits as directed by your health care provider. This is important. PREVENTION  The following substances can cause atrial fibrillation to recur:   Caffeinated beverages.  Alcohol.  Certain medicines, especially those used for breathing problems.  Certain herbs and herbal medicines, such as those containing ephedra or ginseng.  Illegal drugs, such as cocaine and amphetamines. Sometimes medicines are given to prevent atrial fibrillation from recurring. Proper treatment of any underlying condition is also important in helping prevent recurrence.  SEEK MEDICAL CARE IF:  You notice a change in the rate, rhythm, or strength of your heartbeat.  You suddenly begin urinating more frequently.  You tire more easily when exerting yourself or exercising. SEEK IMMEDIATE MEDICAL CARE IF:   You have chest pain, abdominal pain, sweating, or weakness.  You feel nauseous.  You have shortness of breath.  You suddenly have swollen feet and ankles.  You feel dizzy.  Your face or limbs feel numb or weak.  You have a change in your vision or speech. MAKE SURE YOU:   Understand these instructions.  Will watch your condition.  Will get help right away if you are not doing well or get worse. Document Released: 11/16/2005 Document Revised: 04/02/2014 Document Reviewed: 12/27/2012 [facility] Patient Information 2015 [place], [place]. This information is not intended to replace advice given to you by your health care provider. Make sure you discuss any questions you have with your health care provider.  Metoprolol tablets What is this  medicine? METOPROLOL (me TOE proe lole) is a beta-blocker. Beta-blockers reduce the workload on the heart and help it to beat more regularly. This medicine is used to treat high blood pressure and to prevent chest pain. It is also used to after a heart attack and to prevent an additional heart attack from occurring. This medicine may be  used for other purposes; ask your health care provider or pharmacist if you have questions. COMMON BRAND NAME(S): Lopressor What should I tell my health care provider before I take this medicine? They need to know if you have any of these conditions: -diabetes -heart or vessel disease like slow heart rate, worsening heart failure, heart block, sick sinus syndrome or Raynaud's disease -kidney disease -liver disease -lung or breathing disease, like asthma or emphysema -pheochromocytoma -thyroid disease -an unusual or allergic reaction to metoprolol, other beta-blockers, medicines, foods, dyes, or preservatives -pregnant or trying to get pregnant -breast-feeding How should I use this medicine? Take this medicine by mouth with a drink of water. Follow the directions on the prescription label. Take this medicine immediately after meals. Take your doses at regular intervals. Do not take more medicine than directed. Do not stop taking this medicine suddenly. This could lead to serious heart-related effects. Talk to your pediatrician regarding the use of this medicine in children. Special care may be needed. Overdosage: If you think you have taken too much of this medicine contact a poison control center or emergency room at once. NOTE: This medicine is only for you. Do not share this medicine with others. What if I miss a dose? If you miss a dose, take it as soon as you can. If it is almost time for your next dose, take only that dose. Do not take double or extra doses. What may interact with this medicine? This medicine may interact with the following medications: -certain medicines for blood pressure, heart disease, irregular heart beat -certain medicines for depression like monoamine oxidase (MAO) inhibitors, fluoxetine, or paroxetine -clonidine -dobutamine -epinephrine -isoproterenol -reserpine This list may not describe all possible interactions. Give your health care provider a list of  all the medicines, herbs, non-prescription drugs, or dietary supplements you use. Also tell them if you smoke, drink alcohol, or use illegal drugs. Some items may interact with your medicine. What should I watch for while using this medicine? Visit your doctor or health care professional for regular check ups. Contact your doctor right away if your symptoms worsen. Check your blood pressure and pulse rate regularly. Ask your health care professional what your blood pressure and pulse rate should be, and when you should contact them. You may get drowsy or dizzy. Do not drive, use machinery, or do anything that needs mental alertness until you know how this medicine affects you. Do not sit or stand up quickly, especially if you are an older patient. This reduces the risk of dizzy or fainting spells. Contact your doctor if these symptoms continue. Alcohol may interfere with the effect of this medicine. Avoid alcoholic drinks. What side effects may I notice from receiving this medicine? Side effects that you should report to your doctor or health care professional as soon as possible: -allergic reactions like skin rash, itching or hives -cold or numb hands or feet -depression -difficulty breathing -faint -fever with sore throat -irregular heartbeat, chest pain -rapid weight gain -swollen legs or ankles Side effects that usually do not require medical attention (report to your doctor or health care professional if they continue or  are bothersome): -anxiety or nervousness -change in sex drive or performance -dry skin -headache -nightmares or trouble sleeping -short term memory loss -stomach upset or diarrhea -unusually tired This list may not describe all possible side effects. Call your doctor for medical advice about side effects. You may report side effects to FDA at 1-800-FDA-1088. Where should I keep my medicine? Keep out of the reach of children. Store at room temperature between 15 and 30  degrees C (59 and 86 degrees F). Throw away any unused medicine after the expiration date. NOTE: This sheet is a summary. It may not cover all possible information. If you have questions about this medicine, talk to your doctor, pharmacist, or health care provider.  2015, [employer]. (2013-07-21 14:40:36)

## 2015-02-10 NOTE — Discharge Summary (Signed)
[facility]  Physician Discharge Summary   Patient ID: Fred Morris MRN: [id number] DOB/AGE: [birth year] 81 y.o.  Admit date: 02/08/2015 Discharge date: 02/10/2015  PCP: Geoffery Lyons, MD  DISCHARGE DIAGNOSES:  Principal Problem:   Syncope Active Problems:   Atrial fibrillation, new onset   Left flank pain   RECOMMENDATIONS FOR OUTPATIENT FOLLOW UP: 1. To follow-up with his PCP to discuss further management of atrial fibrillation and anticoagulation. 2. Home health will be arranged.  DISCHARGE CONDITION: fair  Diet recommendation: Heart healthy  Filed Weights   02/09/15 0038 02/10/15 0417  Weight: 63.9 kg (140 lb 14 oz) 65.2 kg (143 lb 11.8 oz)    INITIAL HISTORY: 81 year old Caucasian male with a past medical history of hypertension, prostate cancer, who was brought into the emergency department after an episode of syncope. Apparently he was walking to the bathroom after having breakfast when he felt dizzy and fell and lost consciousness. He was the brought into the emergency department. He had an EKG which showed atrial fibrillation which appears to be a new diagnosis for him. He was hospitalized for further management.  Consultations:  None  Procedures: 2-D echocardiogram Study Conclusions - Left ventricle: The cavity size was normal. Wall thickness was atthe upper limits of normal. The estimated ejection fraction was55%. Wall motion was normal; there were no regional wall motionabnormalities. The study is not technically sufficient to allowevaluation of LV diastolic function. - Aortic valve: Mildly calcified annulus. Trileaflet. There wasmild regurgitation. - Aortic root: The aortic root was mildly dilated. - Mitral valve: Calcified annulus. There was mild regurgitation. - Left atrium: The atrium was severely dilated. - Right atrium: Central venous pressure (est): 3 mm Hg. - Tricuspid valve: There was mild regurgitation. - Pulmonary arteries: PA  peak pressure: 23 mm Hg (S). - Pericardium, extracardiac: There was no pericardial effusion. Impressions: - Upper normal LV wall thickness with LVEF approximately 10%,CHENIDPOEUMPN diastolic function. Severe left atrial enlargement.Mild mitral and tricuspid regurgitation. Sclerotic aortic valvewith mild aortic regurgitation. PASP 23 mmHg.  HOSPITAL COURSE:   Syncope Possibly neurocardiogenic. EKG did show A. fib but there was no RVR. She was seen by physical therapy. Home health has been arranged, which will be ordered. Echocardiogram as noted above.   Atrial fibrillation new onset Patient was never tachycardic. His heart rate was reasonably well controlled. He was changed over from amlodipine to metoprolol. TSH was normal. Patient's chads 2 VASC score is 3 and hence anticoagulation is indicated for stroke prevention. But patient has had 2 falls within the last 1 week. Discussed anticoagulation with the patient and his daughter. They seem reluctant as patient's wife had a very bad outcome when she was on anticoagulation. I think it might be reasonable at this time to defer this decision to the outpatient setting when the patient can discuss this further with his primary care physician. Patient and his daughter agree. Echocardiogram as noted above. Left atrium is noted to be enlarged. EF is normal. PCP to determine if the outpatient consultation with cardiology as necessary. For now, he'll be discharged on full dose aspirin.  Left flank pain with bruising This was as a result of his fall. CT of the abdomen and pelvis did not show any intra-abdominal trauma. Rib fractures and soft tissue injury was noted. Pain control.  History of depression Continuing home medications  History of prostate cancer was treated with radiation. Stable  Overall remains stable. Heart rate is well controlled. Discussed in detail with the patient and his  daughter. They are agreeable with the plan. He'll be discharged  today.   PERTINENT LABS:  The results of significant diagnostics from this hospitalization (including imaging, microbiology, ancillary and laboratory) are listed below for reference.     Labs: Basic Metabolic Panel:  Recent Labs Lab 02/08/15 1756 02/08/15 1808 02/09/15 0333 02/10/15 0525  NA 139 141 140 137  K 3.5 3.4* 3.5 3.5  CL 105 102 105 104  CO2 27  --  25 28  GLUCOSE 119* 117* 109* 101*  BUN 23 26* 19 24*  CREATININE 0.99 0.90 0.90 0.98  CALCIUM 9.1  --  8.9 8.6   Liver Function Tests:  Recent Labs Lab 02/09/15 0333  AST 30  ALT 18  ALKPHOS 64  BILITOT 1.2  PROT 6.7  ALBUMIN 3.5   CBC:  Recent Labs Lab 02/08/15 1756 02/08/15 1808 02/09/15 0333 02/10/15 0525  WBC 9.5  --  11.2* 7.5  NEUTROABS  --   --  8.2*  --   HGB 13.7 14.6 14.2 12.6*  HCT 40.7 43.0 42.5 37.8*  MCV 91.5  --  92.8 91.7  PLT 133*  --  126* 122*   Cardiac Enzymes:  Recent Labs Lab 02/08/15 2210 02/09/15 0114  CKTOTAL 165  --   TROPONINI  --  <0.03   IMAGING STUDIES Ct Abdomen Pelvis Wo Contrast  02/09/2015   CLINICAL DATA:  81 year old male with left flank pain. Fall after dizziness, loss of consciousness injuring left flank area. Evaluate for hematoma.  EXAM: CT ABDOMEN AND PELVIS WITHOUT CONTRAST  TECHNIQUE: Multidetector CT imaging of the abdomen and pelvis was performed following the standard protocol without IV contrast.  COMPARISON:  None.  FINDINGS: Mildly displaced fractures of left posterior eleventh and twelfth ribs. Minimal pleural thickening at the left lung base, no pneumothorax. Cardiomegaly and coronary artery calcifications. No pleural effusion or parenchymal consolidation. Minimal reticular opacities anteriorly.  Subcutaneous soft tissue edema involving the left flank with thickening of the posterior lateral abdominal wall musculature. No focal hematoma.  Evaluation for intra-abdominal injury is limited given lack of contrast. Allowing for this, no evidence of  acute traumatic injury to the spleen, pancreas, adrenal glands, liver, gallbladder, or kidneys. There is an 11 mm hypodensity in the mid spleen. Nonobstructing calculus in the mid left kidney. Small parenchymal calcifications in the mid left kidney. No hydronephrosis. Mild left adrenal nodularity.  Small hiatal hernia. There are no dilated or thickened bowel loops. Diverticulosis without diverticulitis. No mesenteric hematoma. No free air, free fluid, or intra-abdominal fluid collection.  Normal caliber atherosclerotic abdominal aorta which is tortuous. No retroperitoneal free fluid.  In the pelvis the urinary bladder is physiologically distended. There are prostate calcifications. Fat in the left inguinal canal. No pelvic free fluid.  No pelvic fracture. Scoliotic curvature and multilevel degenerative change in the lumbar spine, no lumbar spine fracture.  IMPRESSION: 1. Mildly displaced fractures posterior left eleventh and twelfth ribs. Minimal left pleural thickening without large pleural effusion or pneumothorax. 2. Subcutaneous soft tissue edema of the left flank with adjacent thickening/soft tissue injury to the posterior abdominal/flank wall musculature. 3. No evidence of intra-abdominal/pelvic traumatic injury allowing for lack of contrast. 4. Round 11 mm hypodensity in the mid spleen. This does not appear traumatic and is likely an incidental benign finding. 5. Incidental findings of diverticulosis without diverticulitis, small hiatal hernia, and atherosclerosis.   Electronically Signed   By: Jeb Levering M.D.   On: 02/09/2015 03:45   Dg Chest 2  View  02/08/2015   CLINICAL DATA:  81 year old male post fall and loss of consciousness.  EXAM: CHEST  2 VIEW  COMPARISON:  None.  FINDINGS: Heart is at the upper limits of normal in size. There is mild hyperinflation. Pulmonary vasculature is normal. Minimal atelectasis or scarring at the costophrenic angles. No consolidation, pleural effusion, or  pneumothorax. No acute osseous abnormalities are seen.  IMPRESSION: 1.  No acute pulmonary process. 2. Mild hyperinflation and scarring or atelectasis at the costophrenic angles.   Electronically Signed   By: Jeb Levering M.D.   On: 02/08/2015 21:53   Ct Head Wo Contrast  02/08/2015   CLINICAL DATA:  He stated that while eating breakfast,while walking to the bathroom he got dizzy and suffered LOC. Following this he reports feeling week "all over" and crawled into his den where he reports he sat for "a few hours". He reports hitting his head on the right side and his abdomen on his left side. He has a history of macular degeneration, HTN. He denies pain at this time  EXAM: CT HEAD WITHOUT CONTRAST  TECHNIQUE: Contiguous axial images were obtained from the base of the skull through the vertex without intravenous contrast.  COMPARISON:  None.  FINDINGS: Examination demonstrates mild age related atrophy. Ventricles and cisterns are otherwise within normal. There is chronic ischemic microvascular disease present. There is no mass, mass effect, shift of midline structures or acute hemorrhage. There is no evidence to suggest acute infarction. Possible tiny lacunar infarcts of the basal ganglia. Remaining bones are within normal. Minimal mucosal membrane thickening over the posterior sphenoid sinus.  IMPRESSION: No acute intracranial findings.  Chronic ischemic microvascular disease and mild age related atrophy.   Electronically Signed   By: Marin Olp M.D.   On: 02/08/2015 22:00    DISCHARGE EXAMINATION: Filed Vitals:   02/09/15 1118 02/09/15 1416 02/09/15 2106 02/10/15 0417  BP: 116/60 91/46 132/62 103/56  Pulse: 77 66 73 76  Temp:  98.1 F (36.7 C) 97.5 F (36.4 C) 98 F (36.7 C)  TempSrc:  Oral Oral Oral  Resp:  18 18 18   Height:      Weight:    65.2 kg (143 lb 11.8 oz)  SpO2:  96% 99% 99%   General appearance: alert, cooperative, appears stated age and no distress Resp: clear to  auscultation bilaterally Cardio: regular rate and rhythm, S1, S2 normal, no murmur, click, rub or gallop GI: soft, non-tender; bowel sounds normal; no masses,  no organomegaly Extremities: extremities normal, atraumatic, no cyanosis or edema  DISPOSITION: Home with daughter  Discharge Instructions    Call MD for:  difficulty breathing, headache or visual disturbances    Complete by:  As directed      Call MD for:  extreme fatigue    Complete by:  As directed      Call MD for:  persistant dizziness or light-headedness    Complete by:  As directed      Call MD for:  persistant nausea and vomiting    Complete by:  As directed      Call MD for:  severe uncontrolled pain    Complete by:  As directed      Diet - low sodium heart healthy    Complete by:  As directed      Discharge instructions    Complete by:  As directed   Please schedule appointment with Dr. Reynaldo Minium ASAP for a visit this week to discuss atrial  fibrillation and whether he should be on blood thinners.     Increase activity slowly    Complete by:  As directed             Current Discharge Medication List    START taking these medications   Details  aspirin EC 325 MG tablet Take 1 tablet (325 mg total) by mouth daily. Qty: 30 tablet, Refills: 2    metoprolol tartrate (LOPRESSOR) 25 MG tablet Take 0.5 tablets (12.5 mg total) by mouth daily. Qty: 30 tablet, Refills: 0      CONTINUE these medications which have NOT CHANGED   Details  citalopram (CELEXA) 20 MG tablet Take 20 mg by mouth at bedtime.    Multiple Vitamins-Minerals (PRESERVISION AREDS 2) CAPS Take 1 capsule by mouth 2 (two) times daily.    Probiotic Product (PROBIOTIC DAILY) CAPS Take 1 capsule by mouth 2 (two) times daily.      STOP taking these medications     amLODipine (NORVASC) 10 MG tablet        Follow-up Information    Follow up with ARONSON,RICHARD A, MD. Schedule an appointment as soon as possible for a visit in 4 days.   Specialty:   Internal Medicine   Why:  post hospitalization follow up and to discuss atrial fibrillation and treatment options   Contact information:   [place] [address] [phone]       TOTAL DISCHARGE TIME: 64 minutes  [facility] Pager [phone]  02/10/2015, 1:56 PM

## 2015-02-10 NOTE — Progress Notes (Signed)
*  PRELIMINARY RESULTS* Echocardiogram 2D Echocardiogram has been performed.  Avanell Shackleton M 02/10/2015, 12:33 PM

## 2015-05-22 ENCOUNTER — Encounter (INDEPENDENT_AMBULATORY_CARE_PROVIDER_SITE_OTHER): Payer: Medicare Other | Admitting: Ophthalmology

## 2015-05-22 DIAGNOSIS — I10 Essential (primary) hypertension: Secondary | ICD-10-CM

## 2015-05-22 DIAGNOSIS — H3531 Nonexudative age-related macular degeneration: Secondary | ICD-10-CM

## 2015-05-22 DIAGNOSIS — H3532 Exudative age-related macular degeneration: Secondary | ICD-10-CM | POA: Diagnosis not present

## 2015-05-22 DIAGNOSIS — H35033 Hypertensive retinopathy, bilateral: Secondary | ICD-10-CM | POA: Diagnosis not present

## 2015-05-22 DIAGNOSIS — H43813 Vitreous degeneration, bilateral: Secondary | ICD-10-CM

## 2015-06-20 ENCOUNTER — Encounter (INDEPENDENT_AMBULATORY_CARE_PROVIDER_SITE_OTHER): Payer: Medicare Other | Admitting: Ophthalmology

## 2015-06-20 DIAGNOSIS — I10 Essential (primary) hypertension: Secondary | ICD-10-CM

## 2015-06-20 DIAGNOSIS — H43813 Vitreous degeneration, bilateral: Secondary | ICD-10-CM

## 2015-06-20 DIAGNOSIS — H3532 Exudative age-related macular degeneration: Secondary | ICD-10-CM | POA: Diagnosis not present

## 2015-06-20 DIAGNOSIS — H35033 Hypertensive retinopathy, bilateral: Secondary | ICD-10-CM | POA: Diagnosis not present

## 2015-06-20 DIAGNOSIS — H3531 Nonexudative age-related macular degeneration: Secondary | ICD-10-CM

## 2015-07-18 ENCOUNTER — Encounter (INDEPENDENT_AMBULATORY_CARE_PROVIDER_SITE_OTHER): Payer: Medicare Other | Admitting: Ophthalmology

## 2015-07-18 DIAGNOSIS — I10 Essential (primary) hypertension: Secondary | ICD-10-CM | POA: Diagnosis not present

## 2015-07-18 DIAGNOSIS — H3531 Nonexudative age-related macular degeneration: Secondary | ICD-10-CM

## 2015-07-18 DIAGNOSIS — H35033 Hypertensive retinopathy, bilateral: Secondary | ICD-10-CM | POA: Diagnosis not present

## 2015-07-18 DIAGNOSIS — H43813 Vitreous degeneration, bilateral: Secondary | ICD-10-CM | POA: Diagnosis not present

## 2015-07-18 DIAGNOSIS — H3532 Exudative age-related macular degeneration: Secondary | ICD-10-CM | POA: Diagnosis not present

## 2015-07-19 ENCOUNTER — Encounter (INDEPENDENT_AMBULATORY_CARE_PROVIDER_SITE_OTHER): Payer: Medicare Other | Admitting: Ophthalmology

## 2015-08-01 ENCOUNTER — Telehealth (HOSPITAL_COMMUNITY): Payer: Self-pay

## 2015-08-01 ENCOUNTER — Emergency Department (HOSPITAL_COMMUNITY): Payer: Medicare Other

## 2015-08-01 ENCOUNTER — Emergency Department (HOSPITAL_COMMUNITY)
Admission: EM | Admit: 2015-08-01 | Discharge: 2015-08-01 | Disposition: A | Discharge status: Home / Self Care | Payer: Medicare Other | Attending: Emergency Medicine | Admitting: Emergency Medicine

## 2015-08-01 ENCOUNTER — Encounter (HOSPITAL_COMMUNITY): Payer: Self-pay | Admitting: Emergency Medicine

## 2015-08-01 DIAGNOSIS — Z7982 Long term (current) use of aspirin: Secondary | ICD-10-CM | POA: Insufficient documentation

## 2015-08-01 DIAGNOSIS — S0083XA Contusion of other part of head, initial encounter: Secondary | ICD-10-CM | POA: Diagnosis not present

## 2015-08-01 DIAGNOSIS — Z8669 Personal history of other diseases of the nervous system and sense organs: Secondary | ICD-10-CM | POA: Insufficient documentation

## 2015-08-01 DIAGNOSIS — Y9389 Activity, other specified: Secondary | ICD-10-CM | POA: Diagnosis not present

## 2015-08-01 DIAGNOSIS — Y92009 Unspecified place in unspecified non-institutional (private) residence as the place of occurrence of the external cause: Secondary | ICD-10-CM | POA: Diagnosis not present

## 2015-08-01 DIAGNOSIS — W1839XA Other fall on same level, initial encounter: Secondary | ICD-10-CM | POA: Diagnosis not present

## 2015-08-01 DIAGNOSIS — S0990XA Unspecified injury of head, initial encounter: Secondary | ICD-10-CM | POA: Diagnosis present

## 2015-08-01 DIAGNOSIS — W19XXXD Unspecified fall, subsequent encounter: Secondary | ICD-10-CM

## 2015-08-01 DIAGNOSIS — Z8546 Personal history of malignant neoplasm of prostate: Secondary | ICD-10-CM | POA: Insufficient documentation

## 2015-08-01 DIAGNOSIS — Y998 Other external cause status: Secondary | ICD-10-CM | POA: Diagnosis not present

## 2015-08-01 DIAGNOSIS — F0390 Unspecified dementia without behavioral disturbance: Secondary | ICD-10-CM | POA: Insufficient documentation

## 2015-08-01 DIAGNOSIS — I10 Essential (primary) hypertension: Secondary | ICD-10-CM | POA: Insufficient documentation

## 2015-08-01 DIAGNOSIS — S79922A Unspecified injury of left thigh, initial encounter: Secondary | ICD-10-CM | POA: Diagnosis not present

## 2015-08-01 DIAGNOSIS — Z79899 Other long term (current) drug therapy: Secondary | ICD-10-CM | POA: Insufficient documentation

## 2015-08-01 DIAGNOSIS — E119 Type 2 diabetes mellitus without complications: Secondary | ICD-10-CM | POA: Insufficient documentation

## 2015-08-01 LAB — BASIC METABOLIC PANEL
ANION GAP: 9 (ref 5–15)
BUN: 18 mg/dL (ref 6–20)
CHLORIDE: 98 mmol/L — AB (ref 101–111)
CO2: 27 mmol/L (ref 22–32)
Calcium: 8.9 mg/dL (ref 8.9–10.3)
Creatinine, Ser: 0.99 mg/dL (ref 0.61–1.24)
GFR calc Af Amer: >60 mL/min (ref >60)
GFR calc non Af Amer: >60 mL/min (ref >60)
Glucose, Bld: 115 mg/dL — ABNORMAL HIGH (ref 65–99)
Potassium: 3.6 mmol/L (ref 3.5–5.1)
Sodium: 134 mmol/L — ABNORMAL LOW (ref 135–145)

## 2015-08-01 LAB — URINE MICROSCOPIC-ADD ON

## 2015-08-01 LAB — URINALYSIS, ROUTINE W REFLEX MICROSCOPIC
Bilirubin Urine: NEGATIVE
Glucose, UA: NEGATIVE mg/dL
Ketones, ur: 15 mg/dL — AB
LEUKOCYTES UA: NEGATIVE
Nitrite: NEGATIVE
SPECIFIC GRAVITY, URINE: 1.013 (ref 1.005–1.030)
Urobilinogen, UA: 0.2 mg/dL (ref 0.0–1.0)
pH: 7 (ref 5.0–8.0)

## 2015-08-01 LAB — CBC WITH DIFFERENTIAL/PLATELET
BASOS PCT: 0 % (ref 0–1)
Basophils Absolute: 0 10*3/uL (ref 0.0–0.1)
Eosinophils Absolute: 0.1 10*3/uL (ref 0.0–0.7)
Eosinophils Relative: 0 % (ref 0–5)
HEMATOCRIT: 41.5 % (ref 39.0–52.0)
HEMOGLOBIN: 14 g/dL (ref 13.0–17.0)
Lymphocytes Relative: 8 % — ABNORMAL LOW (ref 12–46)
Lymphs Abs: 1.1 10*3/uL (ref 0.7–4.0)
MCH: 30.3 pg (ref 26.0–34.0)
MCHC: 33.7 g/dL (ref 30.0–36.0)
MCV: 89.8 fL (ref 78.0–100.0)
MONOS PCT: 5 % (ref 3–12)
Monocytes Absolute: 0.7 10*3/uL (ref 0.1–1.0)
NEUTROS ABS: 11.6 10*3/uL — AB (ref 1.7–7.7)
NEUTROS PCT: 87 % — AB (ref 43–77)
Platelets: 125 10*3/uL — ABNORMAL LOW (ref 150–400)
RBC: 4.62 MIL/uL (ref 4.22–5.81)
RDW: 14.5 % (ref 11.5–15.5)
WBC: 13.4 10*3/uL — AB (ref 4.0–10.5)

## 2015-08-01 NOTE — ED Notes (Signed)
Returned from CT scan. Placed back on the monitor

## 2015-08-01 NOTE — ED Notes (Signed)
Pt unable to sit still in CT at this time. Asked to bring pt back at this time.

## 2015-08-01 NOTE — Discharge Instructions (Signed)
Dementia Dementia is a general term for problems with brain function. A person with dementia has memory loss and a hard time with at least one other brain function such as thinking, speaking, or problem solving. Dementia can affect social functioning, how you do your job, your mood, or your personality. The changes may be hidden for a long time. The earliest forms of this disease are usually not detected by family or friends. Dementia can be:  Irreversible.  Potentially reversible.  Partially reversible.  Progressive. This means it can get worse over time. CAUSES  Irreversible dementia causes may include:  Degeneration of brain cells (Alzheimer disease or Lewy body dementia).  Multiple small strokes (vascular dementia).  Infection (chronic meningitis or Creutzfeldt-Jakob disease).  Frontotemporal dementia. This affects younger people, age 40 to 70, compared to those who have Alzheimer disease.  Dementia associated with other disorders like Parkinson disease, Huntington disease, or HIV-associated dementia. Potentially or partially reversible dementia causes may include:  Medicines.  Metabolic causes such as excessive alcohol intake, vitamin B12 deficiency, or thyroid disease.  Masses or pressure in the brain such as a tumor, blood clot, or hydrocephalus. SIGNS AND SYMPTOMS  Symptoms are often hard to detect. Family members or coworkers may not notice them early in the disease process. Different people with dementia may have different symptoms. Symptoms can include:  A hard time with memory, especially recent memory. Long-term memory may not be impaired.  Asking the same question multiple times or forgetting something someone just said.  A hard time speaking your thoughts or finding certain words.  A hard time solving problems or performing familiar tasks (such as how to use a telephone).  Sudden changes in mood.  Changes in personality, especially increasing moodiness or  mistrust.  Depression.  A hard time understanding complex ideas that were never a problem in the past. DIAGNOSIS  There are no specific tests for dementia.   Your health care provider may recommend a thorough evaluation. This is because some forms of dementia can be reversible. The evaluation will likely include a physical exam and getting a detailed history from you and a family member. The history often gives the best clues and suggestions for a diagnosis.  Memory testing may be done. A detailed brain function evaluation called neuropsychologic testing may be helpful.  Lab tests and brain imaging (such as a CT scan or MRI scan) are sometimes important.  Sometimes observation and re-evaluation over time is very helpful. TREATMENT  Treatment depends on the cause.   If the problem is a vitamin deficiency, it may be helped or cured with supplements.  For dementias such as Alzheimer disease, medicines are available to stabilize or slow the course of the disease. There are no cures for this type of dementia.  Your health care provider can help direct you to groups, organizations, and other health care providers to help with decisions in the care of you or your loved one. HOME CARE INSTRUCTIONS The care of individuals with dementia is varied and dependent upon the progression of the dementia. The following suggestions are intended for the person living with, or caring for, the person with dementia.  Create a safe environment.  Remove the locks on bathroom doors to prevent the person from accidentally locking himself or herself in.  Use childproof latches on kitchen cabinets and any place where cleaning supplies, chemicals, or alcohol are kept.  Use childproof covers in unused electrical outlets.  Install childproof devices to keep doors and windows   secured.  Remove stove knobs or install safety knobs and an automatic shut-off on the stove.  Lower the temperature on water  heaters.  Label medicines and keep them locked up.  Secure knives, lighters, matches, power tools, and guns, and keep these items out of reach.  Keep the house free from clutter. Remove rugs or anything that might contribute to a fall.  Remove objects that might break and hurt the person.  Make sure lighting is good, both inside and outside.  Install grab rails as needed.  Use a monitoring device to alert you to falls or other needs for help.  Reduce confusion.  Keep familiar objects and people around.  Use night lights or dim lights at night.  Label items or areas.  Use reminders, notes, or directions for daily activities or tasks.  Keep a simple, consistent routine for waking, meals, bathing, dressing, and bedtime.  Create a calm, quiet environment.  Place large clocks and calendars prominently.  Display emergency numbers and home address near all telephones.  Use cues to establish different times of the day. An example is to open curtains to let the natural light in during the day.   Use effective communication.  Choose simple words and short sentences.  Use a gentle, calm tone of voice.  Be careful not to interrupt.  If the person is struggling to find a word or communicate a thought, try to provide the word or thought.  Ask one question at a time. Allow the person ample time to answer questions. Repeat the question again if the person does not respond.  Reduce nighttime restlessness.  Provide a comfortable bed.  Have a consistent nighttime routine.  Ensure a regular walking or physical activity schedule. Involve the person in daily activities as much as possible.  Limit napping during the day.  Limit caffeine.  Attend social events that stimulate rather than overwhelm the senses.  Encourage good nutrition and hydration.  Reduce distractions during meal times and snacks.  Avoid foods that are too hot or too cold.  Monitor chewing and swallowing  ability.  Continue with routine vision, hearing, dental, and medical screenings.  Give medicines only as directed by the health care provider.  Monitor driving abilities. Do not allow the person to drive when safe driving is no longer possible.  Register with an identification program which could provide location assistance in the event of a missing person situation. SEEK MEDICAL CARE IF:   New behavioral problems start such as moodiness, aggressiveness, or seeing things that are not there (hallucinations).  Any new problem with brain function happens. This includes problems with balance, speech, or falling a lot.  Problems with swallowing develop.  Any symptoms of other illness happen. Small changes or worsening in any aspect of brain function can be a sign that the illness is getting worse. It can also be a sign of another medical illness such as infection. Seeing a health care provider right away is important. SEEK IMMEDIATE MEDICAL CARE IF:   A fever develops.  New or worsened confusion develops.  New or worsened sleepiness develops.  Staying awake becomes hard to do. Document Released: 05/12/2001 Document Revised: 04/02/2014 Document Reviewed: 04/13/2011 [facility] Patient Information 2015 [place], [place]. This information is not intended to replace advice given to you by your health care provider. Make sure you discuss any questions you have with your health care provider.  Fall Prevention and Home Safety Falls cause injuries and can affect all age groups. It  is possible to prevent falls.  HOW TO PREVENT FALLS  Wear shoes with rubber soles that do not have an opening for your toes.  Keep the inside and outside of your house well lit.  Use night lights throughout your home.  Remove clutter from floors.  Clean up floor spills.  Remove throw rugs or fasten them to the floor with carpet tape.  Do not place electrical cords across pathways.  Put grab bars by your tub,  shower, and toilet. Do not use towel bars as grab bars.  Put handrails on both sides of the stairway. Fix loose handrails.  Do not climb on stools or stepladders, if possible.  Do not wax your floors.  Repair uneven or unsafe sidewalks, walkways, or stairs.  Keep items you use a lot within reach.  Be aware of pets.  Keep emergency numbers next to the telephone.  Put smoke detectors in your home and near bedrooms. Ask your doctor what other things you can do to prevent falls. Document Released: 09/12/2009 Document Revised: 05/17/2012 Document Reviewed: 02/16/2012 [facility] Patient Information 2015 [place], [place]. This information is not intended to replace advice given to you by your health care provider. Make sure you discuss any questions you have with your health care provider.  Cryotherapy Cryotherapy means treatment with cold. Ice or gel packs can be used to reduce both pain and swelling. Ice is the most helpful within the first 24 to 48 hours after an injury or flare-up from overusing a muscle or joint. Sprains, strains, spasms, burning pain, shooting pain, and aches can all be eased with ice. Ice can also be used when recovering from surgery. Ice is effective, has very few side effects, and is safe for most people to use. PRECAUTIONS  Ice is not a safe treatment option for people with:  Raynaud phenomenon. This is a condition affecting small blood vessels in the extremities. Exposure to cold may cause your problems to return.  Cold hypersensitivity. There are many forms of cold hypersensitivity, including:  Cold urticaria. Red, itchy hives appear on the skin when the tissues begin to warm after being iced.  Cold erythema. This is a red, itchy rash caused by exposure to cold.  Cold hemoglobinuria. Red blood cells break down when the tissues begin to warm after being iced. The hemoglobin that carry oxygen are passed into the urine because they cannot combine with blood proteins  fast enough.  Numbness or altered sensitivity in the area being iced. If you have any of the following conditions, do not use ice until you have discussed cryotherapy with your caregiver:  Heart conditions, such as arrhythmia, angina, or chronic heart disease.  High blood pressure.  Healing wounds or open skin in the area being iced.  Current infections.  Rheumatoid arthritis.  Poor circulation.  Diabetes. Ice slows the blood flow in the region it is applied. This is beneficial when trying to stop inflamed tissues from spreading irritating chemicals to surrounding tissues. However, if you expose your skin to cold temperatures for too long or without the proper protection, you can damage your skin or nerves. Watch for signs of skin damage due to cold. HOME CARE INSTRUCTIONS Follow these tips to use ice and cold packs safely.  Place a dry or damp towel between the ice and skin. A damp towel will cool the skin more quickly, so you may need to shorten the time that the ice is used.  For a more rapid response, add gentle compression  to the ice.  Ice for no more than 10 to 20 minutes at a time. The bonier the area you are icing, the less time it will take to get the benefits of ice.  Check your skin after 5 minutes to make sure there are no signs of a poor response to cold or skin damage.  Rest 20 minutes or more between uses.  Once your skin is numb, you can end your treatment. You can test numbness by very lightly touching your skin. The touch should be so light that you do not see the skin dimple from the pressure of your fingertip. When using ice, most people will feel these normal sensations in this order: cold, burning, aching, and numbness.  Do not use ice on someone who cannot communicate their responses to pain, such as small children or people with dementia. HOW TO MAKE AN ICE PACK Ice packs are the most common way to use ice therapy. Other methods include ice massage, ice  baths, and cryosprays. Muscle creams that cause a cold, tingly feeling do not offer the same benefits that ice offers and should not be used as a substitute unless recommended by your caregiver. To make an ice pack, do one of the following:  Place crushed ice or a bag of frozen vegetables in a sealable plastic bag. Squeeze out the excess air. Place this bag inside another plastic bag. Slide the bag into a pillowcase or place a damp towel between your skin and the bag.  Mix 3 parts water with 1 part rubbing alcohol. Freeze the mixture in a sealable plastic bag. When you remove the mixture from the freezer, it will be slushy. Squeeze out the excess air. Place this bag inside another plastic bag. Slide the bag into a pillowcase or place a damp towel between your skin and the bag. SEEK MEDICAL CARE IF:  You develop white spots on your skin. This may give the skin a blotchy (mottled) appearance.  Your skin turns blue or pale.  Your skin becomes waxy or hard.  Your swelling gets worse. MAKE SURE YOU:   Understand these instructions.  Will watch your condition.  Will get help right away if you are not doing well or get worse. Document Released: 07/13/2011 Document Revised: 04/02/2014 Document Reviewed: 07/13/2011 [facility] Patient Information 2015 [place], [place]. This information is not intended to replace advice given to you by your health care provider. Make sure you discuss any questions you have with your health care provider.  Contusion A contusion is a deep bruise. Contusions happen when an injury causes bleeding under the skin. Signs of bruising include pain, puffiness (swelling), and discolored skin. The contusion may turn blue, purple, or yellow. HOME CARE   Put ice on the injured area.  Put ice in a plastic bag.  Place a towel between your skin and the bag.  Leave the ice on for 15-20 minutes, 03-04 times a day.  Only take medicine as told by your doctor.  Rest the injured  area.  If possible, raise (elevate) the injured area to lessen puffiness. GET HELP RIGHT AWAY IF:   You have more bruising or puffiness.  You have pain that is getting worse.  Your puffiness or pain is not helped by medicine. MAKE SURE YOU:   Understand these instructions.  Will watch your condition.  Will get help right away if you are not doing well or get worse. Document Released: 05/04/2008 Document Revised: 02/08/2012 Document Reviewed: 09/21/2011 [employer] Patient  Information 2015 [place], [place]. This information is not intended to replace advice given to you by your health care provider. Make sure you discuss any questions you have with your health care provider.

## 2015-08-01 NOTE — ED Notes (Signed)
Pt unable to lift left leg for longer than 2 secs.

## 2015-08-01 NOTE — ED Notes (Signed)
Pt brought by EMS from home for a unwitnessed fall, pt lives with his daughter, daughter found him lying on the floor on the side of his bed, pt had a fall few weeks ago with broken ribs on the left side. Pt unable to explain how he fell or if he had LOC. Pt denies any pain at this time. Pt AO to him self.

## 2015-08-01 NOTE — ED Notes (Signed)
MD at bedside. 

## 2015-08-01 NOTE — ED Provider Notes (Signed)
CSN: [id number]     Arrival date & time 08/01/15  0459 History   First MD Initiated Contact with Patient 08/01/15 0510     Chief Complaint  Patient presents with  . Fall     (Consider location/radiation/quality/duration/timing/severity/associated sxs/prior Treatment) HPI 81-year-old male presents to the emergency department via EMS from home after a fall.  Family reports they found him wedged in his room behind the bed.  Patient with hematoma to right forehead.  Patient denies any complaint at this time.  He has dementia.  They report over the last few days.  His dementia has worsened, and they're curious to know if he had a urinary tract tract infection. Past Medical History  Diagnosis Date  . Hypertension   . Prostate cancer     "had radiation"  . Age-related macular degeneration, dry, both eyes   . Dementia     "probably; due to his age" (02/09/2015)  . Diabetes mellitus without complication    Past Surgical History  Procedure Laterality Date  . Total shoulder arthroplasty Bilateral 1990's  . Joint replacement    . Cataract extraction w/ intraocular lens  implant, bilateral Bilateral    Family History  Problem Relation Age of Onset  . CAD Brother   . Diabetes Mellitus II Neg Hx    Social History  Substance Use Topics  . Smoking status: Never Smoker   . Smokeless tobacco: Never Used  . Alcohol Use: No    Review of Systems  Level V caveat, dementia  Allergies  Review of patient's allergies indicates no known allergies.  Home Medications   Prior to Admission medications   Medication Sig Start Date End Date Taking? Authorizing Provider  aspirin EC 325 MG tablet Take 1 tablet (325 mg total) by mouth daily. 02/10/15  Yes Bonnielee Haff, MD  citalopram (CELEXA) 20 MG tablet Take 20 mg by mouth at bedtime.   Yes Historical Provider, MD  metoprolol tartrate (LOPRESSOR) 25 MG tablet Take 0.5 tablets (12.5 mg total) by mouth daily. 02/10/15  Yes Bonnielee Haff, MD  Multiple  Vitamins-Minerals (PRESERVISION AREDS 2) CAPS Take 1 capsule by mouth 2 (two) times daily.   Yes Historical Provider, MD  Probiotic Product (PROBIOTIC DAILY) CAPS Take 1 capsule by mouth 2 (two) times daily.   Yes Historical Provider, MD   BP 196/78 mmHg  Pulse 87  Temp(Src) 98.3 F (36.8 C) (Oral)  Resp 15  Ht 5\' 5"  (1.651 m)  Wt 143 lb (64.864 kg)  BMI 23.80 kg/m2  SpO2 95% Physical Exam  Constitutional: He is oriented to person, place, and time. He appears well-developed and well-nourished.  HENT:  Head: Normocephalic.  Right Ear: External ear normal.  Left Ear: External ear normal.  Nose: Nose normal.  Mouth/Throat: Oropharynx is clear and moist.  Contusion and abrasion to right for head  Eyes: Conjunctivae and EOM are normal. Pupils are equal, round, and reactive to light.  Neck: Normal range of motion. Neck supple. No JVD present. No tracheal deviation present. No thyromegaly present.  Cardiovascular: Normal rate, regular rhythm, normal heart sounds and intact distal pulses.  Exam reveals no gallop and no friction rub.   No murmur heard. Pulmonary/Chest: Effort normal and breath sounds normal. No stridor. No respiratory distress. He has no wheezes. He has no rales. He exhibits no tenderness.  Abdominal: Soft. Bowel sounds are normal. He exhibits no distension and no mass. There is no tenderness. There is no rebound and no guarding.  Musculoskeletal: Normal  range of motion. He exhibits tenderness (patient has tenderness with flexion at the left hip.  He reports that his left thigh hurts.  There is no deformity, no pain with external or internal rotation, no shortening). He exhibits no edema.  Lymphadenopathy:    He has no cervical adenopathy.  Neurological: He is alert and oriented to person, place, and time. He displays normal reflexes. He exhibits normal muscle tone. Coordination normal.  Skin: Skin is warm and dry. No rash noted. No erythema. No pallor.  Psychiatric: He has a  normal mood and affect. His behavior is normal. Judgment and thought content normal.  Nursing note and vitals reviewed.   ED Course  Procedures (including critical care time) Labs Review Labs Reviewed  BASIC METABOLIC PANEL - Abnormal; Notable for the following:    Sodium 134 (*)    Chloride 98 (*)    Glucose, Bld 115 (*)    All other components within normal limits  CBC WITH DIFFERENTIAL/PLATELET - Abnormal; Notable for the following:    WBC 13.4 (*)    Platelets 125 (*)    Neutrophils Relative % 87 (*)    Neutro Abs 11.6 (*)    Lymphocytes Relative 8 (*)    All other components within normal limits  URINALYSIS, ROUTINE W REFLEX MICROSCOPIC (NOT AT [facility]) - Abnormal; Notable for the following:    Hgb urine dipstick MODERATE (*)    Ketones, ur 15 (*)    Protein, ur >300 (*)    All other components within normal limits  URINE MICROSCOPIC-ADD ON - Abnormal; Notable for the following:    Casts HYALINE CASTS (*)    All other components within normal limits    Imaging Review Ct Head Wo Contrast  08/01/2015   CLINICAL DATA:  Unwitnessed fall.  Confused.  EXAM: CT HEAD WITHOUT CONTRAST  CT CERVICAL SPINE WITHOUT CONTRAST  TECHNIQUE: Multidetector CT imaging of the head and cervical spine was performed following the standard protocol without intravenous contrast. Multiplanar CT image reconstructions of the cervical spine were also generated.  COMPARISON:  02/08/2015  FINDINGS: CT HEAD FINDINGS  There is no intracranial hemorrhage, mass or evidence of acute infarction. There is moderately severe generalized atrophy. There is moderately severe chronic microvascular ischemic change. There is no significant extra-axial fluid collection.  No acute intracranial findings are evident. The calvarium and skullbase are intact  CT CERVICAL SPINE FINDINGS  The vertebral column, pedicles and facet articulations are intact. There is no evidence of acute fracture. No acute soft tissue abnormalities are  evident.  Moderate degenerative disc and facet changes are present. There is a grade 1 degenerative appearing anterolisthesis of C5 with respect to C4 and C6.  IMPRESSION: 1. Negative for acute intracranial traumatic in under early severe generalized atrophy and chronic small vessel disease. 2. Negative for acute cervical spine fracture.   Electronically Signed   By: Andreas Newport M.D.   On: 08/01/2015 06:37   Ct Cervical Spine Wo Contrast  08/01/2015   CLINICAL DATA:  Unwitnessed fall.  Confused.  EXAM: CT HEAD WITHOUT CONTRAST  CT CERVICAL SPINE WITHOUT CONTRAST  TECHNIQUE: Multidetector CT imaging of the head and cervical spine was performed following the standard protocol without intravenous contrast. Multiplanar CT image reconstructions of the cervical spine were also generated.  COMPARISON:  02/08/2015  FINDINGS: CT HEAD FINDINGS  There is no intracranial hemorrhage, mass or evidence of acute infarction. There is moderately severe generalized atrophy. There is moderately severe chronic microvascular ischemic  change. There is no significant extra-axial fluid collection.  No acute intracranial findings are evident. The calvarium and skullbase are intact  CT CERVICAL SPINE FINDINGS  The vertebral column, pedicles and facet articulations are intact. There is no evidence of acute fracture. No acute soft tissue abnormalities are evident.  Moderate degenerative disc and facet changes are present. There is a grade 1 degenerative appearing anterolisthesis of C5 with respect to C4 and C6.  IMPRESSION: 1. Negative for acute intracranial traumatic in under early severe generalized atrophy and chronic small vessel disease. 2. Negative for acute cervical spine fracture.   Electronically Signed   By: Andreas Newport M.D.   On: 08/01/2015 06:37   Dg Hip Unilat With Pelvis 2-3 Views Left  08/01/2015   CLINICAL DATA:  Unwitnessed fall  EXAM: DG HIP (WITH OR WITHOUT PELVIS) 2-3V LEFT  COMPARISON:  None.  FINDINGS:  There is no evidence of hip fracture or dislocation. There is no evidence of arthropathy or other focal bone abnormality.  IMPRESSION: Negative.   Electronically Signed   By: Andreas Newport M.D.   On: 08/01/2015 06:39   I have personally reviewed and evaluated these images and lab results as part of my medical decision-making.   EKG Interpretation None      MDM   Final diagnoses:  Fall at home, subsequent encounter  Dementia, without behavioral disturbance  Forehead contusion, initial encounter   81 year old male with fall at home and increasing problems with dementia.  Plan for labs, left hip and CT head/C-spine  Workup is complete, CAT scans, x-rays and labs unremarkable.  Patient has a small amount of white blood cells in his urine.  We'll send for culture.  Patient here with family, and will discharge to home to follow-up with his primary care doctor.  Linton Flemings, MD 08/01/15 [phone]

## 2015-08-01 NOTE — ED Notes (Signed)
Pt able to use the urinal. Stated he had to pee

## 2015-08-02 LAB — URINE CULTURE: Culture: NO GROWTH

## 2015-08-16 ENCOUNTER — Encounter (INDEPENDENT_AMBULATORY_CARE_PROVIDER_SITE_OTHER): Payer: Medicare Other | Admitting: Ophthalmology

## 2015-08-16 DIAGNOSIS — H43813 Vitreous degeneration, bilateral: Secondary | ICD-10-CM | POA: Diagnosis not present

## 2015-08-16 DIAGNOSIS — H3532 Exudative age-related macular degeneration: Secondary | ICD-10-CM

## 2015-08-16 DIAGNOSIS — H35033 Hypertensive retinopathy, bilateral: Secondary | ICD-10-CM

## 2015-08-16 DIAGNOSIS — I10 Essential (primary) hypertension: Secondary | ICD-10-CM | POA: Diagnosis not present

## 2015-08-16 DIAGNOSIS — H3531 Nonexudative age-related macular degeneration: Secondary | ICD-10-CM

## 2015-09-11 ENCOUNTER — Encounter (INDEPENDENT_AMBULATORY_CARE_PROVIDER_SITE_OTHER): Payer: Medicare Other | Admitting: Ophthalmology

## 2015-09-11 DIAGNOSIS — H353114 Nonexudative age-related macular degeneration, right eye, advanced atrophic with subfoveal involvement: Secondary | ICD-10-CM

## 2015-09-11 DIAGNOSIS — H43813 Vitreous degeneration, bilateral: Secondary | ICD-10-CM | POA: Diagnosis not present

## 2015-09-11 DIAGNOSIS — H35033 Hypertensive retinopathy, bilateral: Secondary | ICD-10-CM | POA: Diagnosis not present

## 2015-09-11 DIAGNOSIS — H353221 Exudative age-related macular degeneration, left eye, with active choroidal neovascularization: Secondary | ICD-10-CM

## 2015-09-11 DIAGNOSIS — I10 Essential (primary) hypertension: Secondary | ICD-10-CM | POA: Diagnosis not present

## 2015-10-10 ENCOUNTER — Encounter (INDEPENDENT_AMBULATORY_CARE_PROVIDER_SITE_OTHER): Payer: Medicare Other | Admitting: Ophthalmology

## 2015-10-14 ENCOUNTER — Encounter (INDEPENDENT_AMBULATORY_CARE_PROVIDER_SITE_OTHER): Payer: Medicare Other | Admitting: Ophthalmology

## 2015-10-14 DIAGNOSIS — H353114 Nonexudative age-related macular degeneration, right eye, advanced atrophic with subfoveal involvement: Secondary | ICD-10-CM

## 2015-10-14 DIAGNOSIS — H353221 Exudative age-related macular degeneration, left eye, with active choroidal neovascularization: Secondary | ICD-10-CM

## 2015-10-14 DIAGNOSIS — H43813 Vitreous degeneration, bilateral: Secondary | ICD-10-CM | POA: Diagnosis not present

## 2015-10-14 DIAGNOSIS — H35033 Hypertensive retinopathy, bilateral: Secondary | ICD-10-CM | POA: Diagnosis not present

## 2015-10-14 DIAGNOSIS — I10 Essential (primary) hypertension: Secondary | ICD-10-CM

## 2015-11-11 ENCOUNTER — Encounter (INDEPENDENT_AMBULATORY_CARE_PROVIDER_SITE_OTHER): Payer: Medicare Other | Admitting: Ophthalmology

## 2015-11-11 DIAGNOSIS — I10 Essential (primary) hypertension: Secondary | ICD-10-CM | POA: Diagnosis not present

## 2015-11-11 DIAGNOSIS — H353114 Nonexudative age-related macular degeneration, right eye, advanced atrophic with subfoveal involvement: Secondary | ICD-10-CM

## 2015-11-11 DIAGNOSIS — H43813 Vitreous degeneration, bilateral: Secondary | ICD-10-CM | POA: Diagnosis not present

## 2015-11-11 DIAGNOSIS — H353221 Exudative age-related macular degeneration, left eye, with active choroidal neovascularization: Secondary | ICD-10-CM

## 2015-11-11 DIAGNOSIS — H35033 Hypertensive retinopathy, bilateral: Secondary | ICD-10-CM

## 2015-12-13 ENCOUNTER — Encounter (INDEPENDENT_AMBULATORY_CARE_PROVIDER_SITE_OTHER): Payer: Medicare Other | Admitting: Ophthalmology

## 2015-12-13 DIAGNOSIS — H353221 Exudative age-related macular degeneration, left eye, with active choroidal neovascularization: Secondary | ICD-10-CM | POA: Diagnosis not present

## 2015-12-13 DIAGNOSIS — I10 Essential (primary) hypertension: Secondary | ICD-10-CM | POA: Diagnosis not present

## 2015-12-13 DIAGNOSIS — H35033 Hypertensive retinopathy, bilateral: Secondary | ICD-10-CM | POA: Diagnosis not present

## 2015-12-13 DIAGNOSIS — H43813 Vitreous degeneration, bilateral: Secondary | ICD-10-CM | POA: Diagnosis not present

## 2015-12-13 DIAGNOSIS — H353114 Nonexudative age-related macular degeneration, right eye, advanced atrophic with subfoveal involvement: Secondary | ICD-10-CM | POA: Diagnosis not present

## 2015-12-14 ENCOUNTER — Emergency Department (HOSPITAL_COMMUNITY)
Admission: EM | Admit: 2015-12-14 | Discharge: 2015-12-14 | Disposition: A | Discharge status: Home / Self Care | Payer: Medicare Other | Attending: Emergency Medicine | Admitting: Emergency Medicine

## 2015-12-14 ENCOUNTER — Emergency Department (HOSPITAL_COMMUNITY): Payer: Medicare Other

## 2015-12-14 ENCOUNTER — Encounter (HOSPITAL_COMMUNITY): Payer: Self-pay

## 2015-12-14 DIAGNOSIS — Z8546 Personal history of malignant neoplasm of prostate: Secondary | ICD-10-CM | POA: Insufficient documentation

## 2015-12-14 DIAGNOSIS — Z7982 Long term (current) use of aspirin: Secondary | ICD-10-CM | POA: Insufficient documentation

## 2015-12-14 DIAGNOSIS — Y92002 Bathroom of unspecified non-institutional (private) residence single-family (private) house as the place of occurrence of the external cause: Secondary | ICD-10-CM | POA: Insufficient documentation

## 2015-12-14 DIAGNOSIS — W19XXXA Unspecified fall, initial encounter: Secondary | ICD-10-CM

## 2015-12-14 DIAGNOSIS — Y9301 Activity, walking, marching and hiking: Secondary | ICD-10-CM | POA: Insufficient documentation

## 2015-12-14 DIAGNOSIS — Y998 Other external cause status: Secondary | ICD-10-CM | POA: Diagnosis not present

## 2015-12-14 DIAGNOSIS — F0390 Unspecified dementia without behavioral disturbance: Secondary | ICD-10-CM | POA: Diagnosis not present

## 2015-12-14 DIAGNOSIS — Z8669 Personal history of other diseases of the nervous system and sense organs: Secondary | ICD-10-CM | POA: Insufficient documentation

## 2015-12-14 DIAGNOSIS — I10 Essential (primary) hypertension: Secondary | ICD-10-CM | POA: Insufficient documentation

## 2015-12-14 DIAGNOSIS — W1839XA Other fall on same level, initial encounter: Secondary | ICD-10-CM | POA: Diagnosis not present

## 2015-12-14 DIAGNOSIS — E119 Type 2 diabetes mellitus without complications: Secondary | ICD-10-CM | POA: Insufficient documentation

## 2015-12-14 DIAGNOSIS — Z79899 Other long term (current) drug therapy: Secondary | ICD-10-CM | POA: Diagnosis not present

## 2015-12-14 DIAGNOSIS — S3992XA Unspecified injury of lower back, initial encounter: Secondary | ICD-10-CM | POA: Diagnosis present

## 2015-12-14 DIAGNOSIS — M545 Low back pain: Secondary | ICD-10-CM

## 2015-12-14 LAB — URINALYSIS, ROUTINE W REFLEX MICROSCOPIC
Bilirubin Urine: NEGATIVE
Glucose, UA: NEGATIVE mg/dL
Ketones, ur: NEGATIVE mg/dL
Leukocytes, UA: NEGATIVE
Nitrite: NEGATIVE
Protein, ur: NEGATIVE mg/dL
Specific Gravity, Urine: 1.011 (ref 1.005–1.030)
pH: 7.5 (ref 5.0–8.0)

## 2015-12-14 LAB — CBC WITH DIFFERENTIAL/PLATELET
Basophils Absolute: 0 10*3/uL (ref 0.0–0.1)
Basophils Relative: 1 %
Eosinophils Absolute: 0.2 10*3/uL (ref 0.0–0.7)
Eosinophils Relative: 3 %
HCT: 45.1 % (ref 39.0–52.0)
Hemoglobin: 15.5 g/dL (ref 13.0–17.0)
Lymphocytes Relative: 20 %
Lymphs Abs: 1.5 10*3/uL (ref 0.7–4.0)
MCH: 31.2 pg (ref 26.0–34.0)
MCHC: 34.4 g/dL (ref 30.0–36.0)
MCV: 90.7 fL (ref 78.0–100.0)
Monocytes Absolute: 0.5 10*3/uL (ref 0.1–1.0)
Monocytes Relative: 7 %
Neutro Abs: 5 10*3/uL (ref 1.7–7.7)
Neutrophils Relative %: 69 %
Platelets: 124 10*3/uL — ABNORMAL LOW (ref 150–400)
RBC: 4.97 MIL/uL (ref 4.22–5.81)
RDW: 14.1 % (ref 11.5–15.5)
WBC: 7.2 10*3/uL (ref 4.0–10.5)

## 2015-12-14 LAB — BASIC METABOLIC PANEL
Anion gap: 9 (ref 5–15)
BUN: 19 mg/dL (ref 6–20)
CO2: 27 mmol/L (ref 22–32)
Calcium: 9.3 mg/dL (ref 8.9–10.3)
Chloride: 105 mmol/L (ref 101–111)
Creatinine, Ser: 0.83 mg/dL (ref 0.61–1.24)
GFR calc Af Amer: >60 mL/min (ref >60)
GFR calc non Af Amer: >60 mL/min (ref >60)
Glucose, Bld: 102 mg/dL — ABNORMAL HIGH (ref 65–99)
Potassium: 3.5 mmol/L (ref 3.5–5.1)
Sodium: 141 mmol/L (ref 135–145)

## 2015-12-14 LAB — URINE MICROSCOPIC-ADD ON

## 2015-12-14 MED ORDER — SODIUM CHLORIDE 0.9 % IV BOLUS (SEPSIS)
500.0000 mL | Freq: Once | INTRAVENOUS | Status: AC
  Administered 2015-12-14: 500 mL via INTRAVENOUS

## 2015-12-14 NOTE — ED Notes (Signed)
Straight in/out cath attempted with rn x 2. Unable to pass cath. Small amount of blood n oted at tip of cath with minimal pressure used. Dr. Wilson Singer notified of same. He request attempt of [place]. Chrge nurse [place] notified of request and states to speak with Santiago Glad, RN

## 2015-12-14 NOTE — ED Notes (Signed)
Pt arrives EMS with c/o fall while walking to bathroom at 0730. Originally assisted to bed then began to c/o low back pain.

## 2015-12-14 NOTE — ED Notes (Signed)
Hope RN states she will attempt Coude cath.

## 2015-12-14 NOTE — ED Notes (Signed)
Fred Morris , RN states she is not Coude checked off. Daughter also states she feels Patient is dehydrated. Dr. Wilson Singer informed oif same

## 2015-12-14 NOTE — ED Notes (Signed)
Pt voids 150 cc to urinal.brief changed with skin cleaned

## 2015-12-14 NOTE — ED Notes (Signed)
Called to room by daughter who states pt is agitated. Pt resting on left side and moves hands but denies papin and is resting fairly quietly. Monitor continues as afib.

## 2015-12-14 NOTE — Discharge Instructions (Signed)

## 2015-12-14 NOTE — ED Notes (Signed)
Pt home with daughter. Daughter verbalizes understanding of instructions.

## 2015-12-23 ENCOUNTER — Encounter (INDEPENDENT_AMBULATORY_CARE_PROVIDER_SITE_OTHER): Payer: Medicare Other | Admitting: Ophthalmology

## 2015-12-25 NOTE — ED Provider Notes (Signed)
CSN: [id number]     Arrival date & time 12/14/15  1013 History   First MD Initiated Contact with Patient 12/14/15 1023     Chief Complaint  Patient presents with  . Fall  . Back Pain     (Consider location/radiation/quality/duration/timing/severity/associated sxs/prior Treatment) HPI   82 year old male with lower back pain. Patient had a mechanical fall at approximately 0 7:30 today. Reports she fell in his bathroom. Was able to be assisted off after the fall. He began having increasing back pain shortly later though. He does not think he struck his head during the fall. He denies any significant acute pain anywhere else. Denies any acute numbness or tingling. Family is at bedside. They report that patient seems to be at his baseline. Takes aspirin. No blood thinners otherwise.  Past Medical History  Diagnosis Date  . Hypertension   . Prostate cancer [facility])     "had radiation"  . Age-related macular degeneration, dry, both eyes   . Dementia     "probably; due to his age" (02/09/2015)  . Diabetes mellitus without complication [facility])    Past Surgical History  Procedure Laterality Date  . Total shoulder arthroplasty Bilateral 1990's  . Joint replacement    . Cataract extraction w/ intraocular lens  implant, bilateral Bilateral    Family History  Problem Relation Age of Onset  . CAD Brother   . Diabetes Mellitus II Neg Hx    Social History  Substance Use Topics  . Smoking status: Never Smoker   . Smokeless tobacco: Never Used  . Alcohol Use: No    Review of Systems  All systems reviewed and negative, other than as noted in HPI.   Allergies  Review of patient's allergies indicates no known allergies.  Home Medications   Prior to Admission medications   Medication Sig Start Date End Date Taking? Authorizing Provider  aspirin EC 325 MG tablet Take 1 tablet (325 mg total) by mouth daily. 02/10/15  Yes Bonnielee Haff, MD  citalopram (CELEXA) 20 MG tablet Take 20 mg by mouth  at bedtime.   Yes Historical Provider, MD  metoprolol tartrate (LOPRESSOR) 25 MG tablet Take 0.5 tablets (12.5 mg total) by mouth daily. 02/10/15  Yes Bonnielee Haff, MD  Multiple Vitamins-Minerals (PRESERVISION AREDS 2) CAPS Take 1 capsule by mouth 2 (two) times daily.   Yes Historical Provider, MD  Probiotic Product (PROBIOTIC DAILY) CAPS Take 1 capsule by mouth 2 (two) times daily.   Yes Historical Provider, MD   BP 158/105 mmHg  Pulse 72  Temp(Src) 97.9 F (36.6 C) (Oral)  Resp 25  SpO2 100% Physical Exam  Constitutional: He appears well-developed and well-nourished. No distress.  HENT:  Head: Normocephalic and atraumatic.  Eyes: Conjunctivae are normal. Right eye exhibits no discharge. Left eye exhibits no discharge.  Neck: Neck supple.  Cardiovascular: Normal rate, regular rhythm and normal heart sounds.  Exam reveals no gallop and no friction rub.   No murmur heard. Pulmonary/Chest: Effort normal and breath sounds normal. No respiratory distress.  Abdominal: Soft. He exhibits no distension. There is no tenderness.  Musculoskeletal: He exhibits no edema or tenderness.  Reports pain in his lower lumbar region. I cannot elicit any significant tenderness on my exam in the midline or paraspinally in this region or elsewhere though.  Neurological: He is alert.  Strength normal and symmetric bilateral lower extremities. Sensation is intact to light touch.  Skin: Skin is warm and dry.  Psychiatric: He has a normal mood  and affect. His behavior is normal. Thought content normal.  Nursing note and vitals reviewed.   ED Course  Procedures (including critical care time) Labs Review Labs Reviewed  URINALYSIS, ROUTINE W REFLEX MICROSCOPIC (NOT AT [facility]) - Abnormal; Notable for the following:    Hgb urine dipstick MODERATE (*)    All other components within normal limits  BASIC METABOLIC PANEL - Abnormal; Notable for the following:    Glucose, Bld 102 (*)    All other components within  normal limits  CBC WITH DIFFERENTIAL/PLATELET - Abnormal; Notable for the following:    Platelets 124 (*)    All other components within normal limits  URINE MICROSCOPIC-ADD ON - Abnormal; Notable for the following:    Squamous Epithelial / LPF 0-5 (*)    Bacteria, UA RARE (*)    All other components within normal limits    Imaging Review No results found. I have personally reviewed and evaluated these images and lab results as part of my medical decision-making.   EKG Interpretation   Date/Time:  Saturday December 14 2015 10:19:38 EST Ventricular Rate:  70 PR Interval:    QRS Duration: 102 QT Interval:  438 QTC Calculation: 473 R Axis:   157 Text Interpretation:  Atrial fibrillation Ventricular premature complex  Probable lateral infarct, age indeterminate Confirmed by Manasa Spease  MD,  Kerrigan Gombos ([id number]) on 12/14/2015 1:44:54 PM      MDM   Final diagnoses:  Fall, initial encounter  Low back pain, unspecified back pain laterality, with sciatica presence unspecified    82 year old male with lower back pain after fall shortly before arrival. Nonfocal neuro exam. Hemodynamically stable. Imaging negative for acute findings. Declining pain medication. No particular concerning features with regards to the fall or his pain. Low suspicion for emergent process.    Virgel Manifold, MD 12/25/15 [phone]

## 2016-01-15 ENCOUNTER — Encounter (INDEPENDENT_AMBULATORY_CARE_PROVIDER_SITE_OTHER): Payer: Medicare Other | Admitting: Ophthalmology

## 2016-03-31 IMAGING — CT CT ABD-PELV W/O CM
2 of 4 series · 15 of 46 positions shown, 17 images · non-contrast
Comparison: None.

CLINICAL DATA: [AGE] male with left flank pain. Fall after
dizziness, loss of consciousness injuring left flank area. Evaluate
for hematoma.

EXAM:
CT ABDOMEN AND PELVIS WITHOUT CONTRAST
TECHNIQUE: Multidetector CT imaging of the abdomen and pelvis was performed
following the standard protocol without IV contrast.

[Series 2: abd/ pelvis 5.0 i30f 1 · axial · 0.78mm/px · z∈[+881,+1311]mm · 12 of 94 slices shown, 14 images]
[im 4/94  soft-tissue]
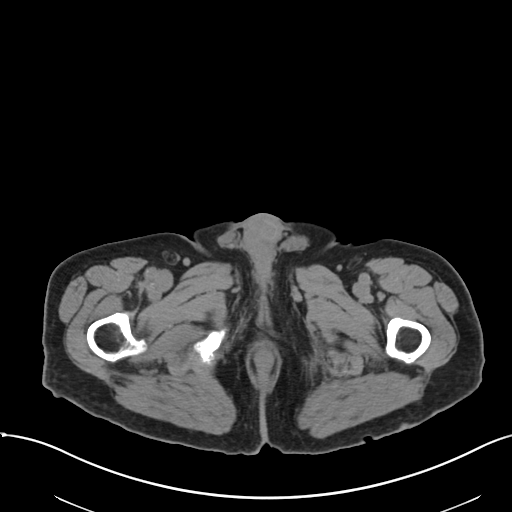
[im 4/94  bone]
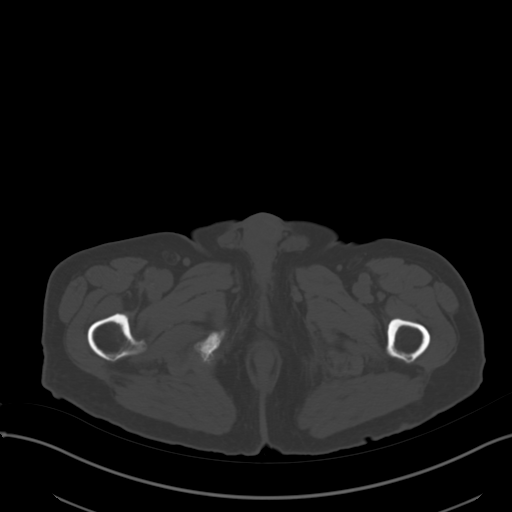
[im 12/94  soft-tissue]
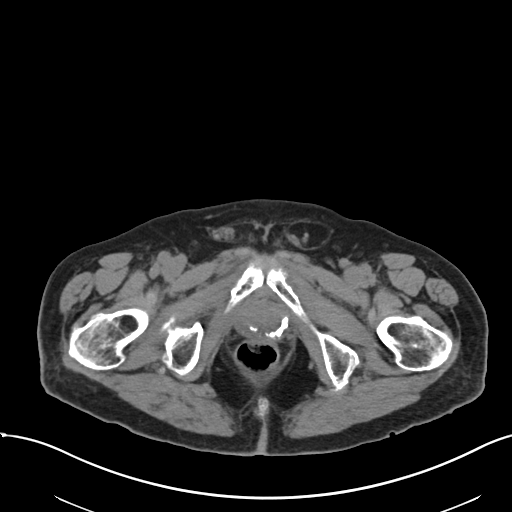
[im 20/94  soft-tissue]
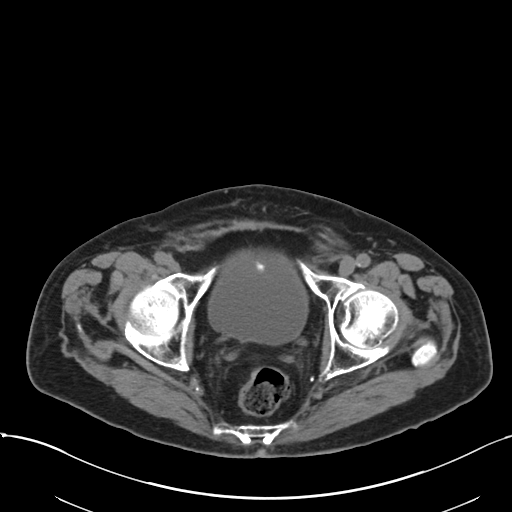
[im 28/94  soft-tissue]
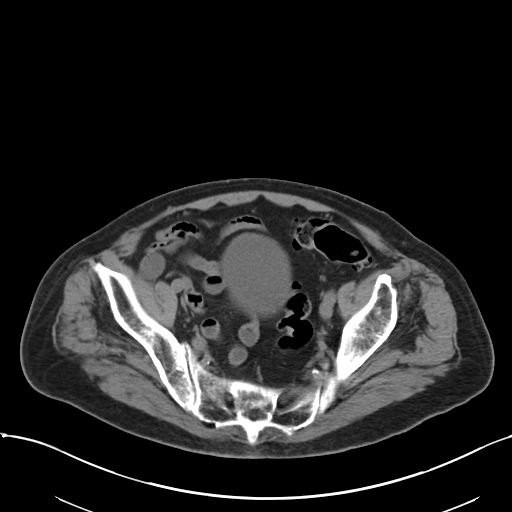
[im 35/94  soft-tissue]
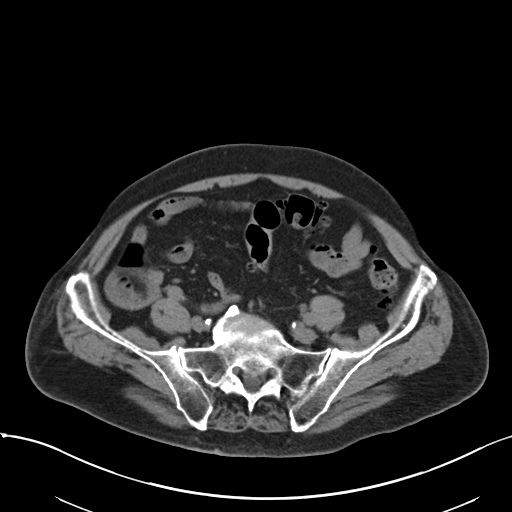
[im 43/94  soft-tissue]
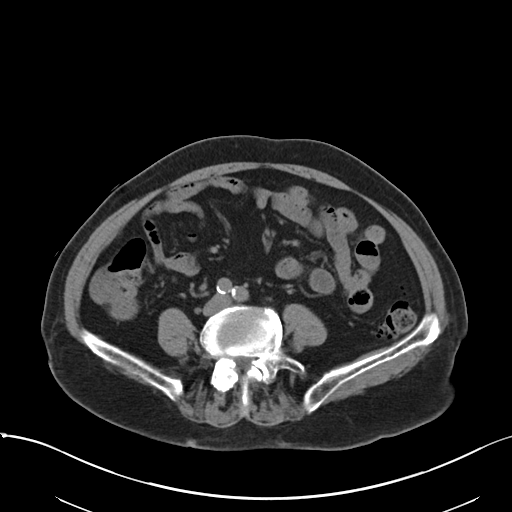
[im 51/94  soft-tissue]
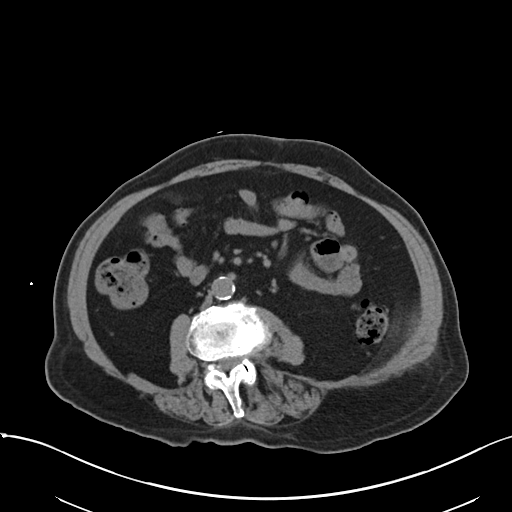
[im 59/94  soft-tissue]
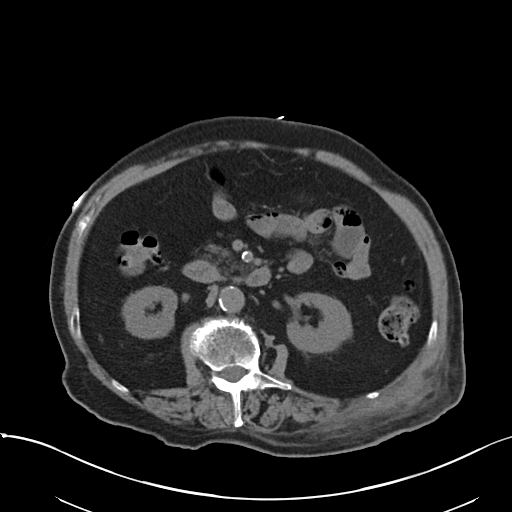
[im 66/94  soft-tissue]
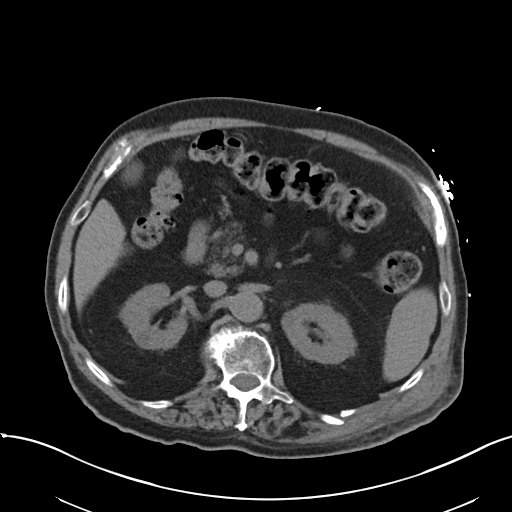
[im 66/94  bone]
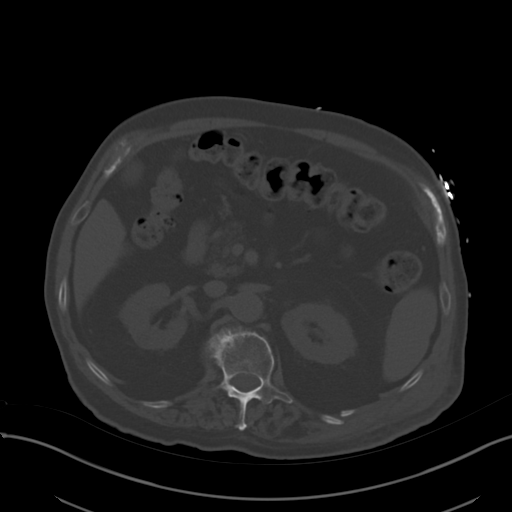
[im 74/94  soft-tissue]
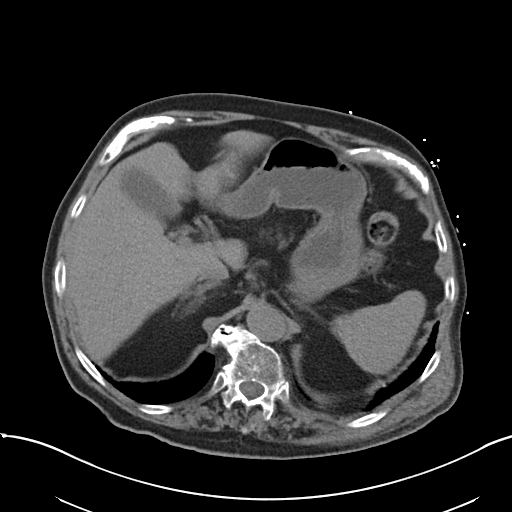
[im 82/94  soft-tissue]
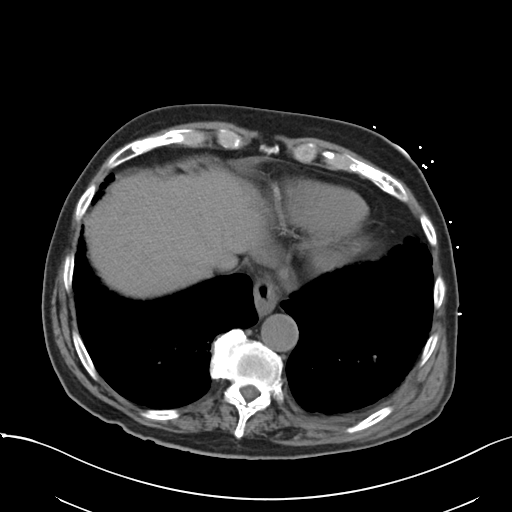
[im 90/94  soft-tissue]
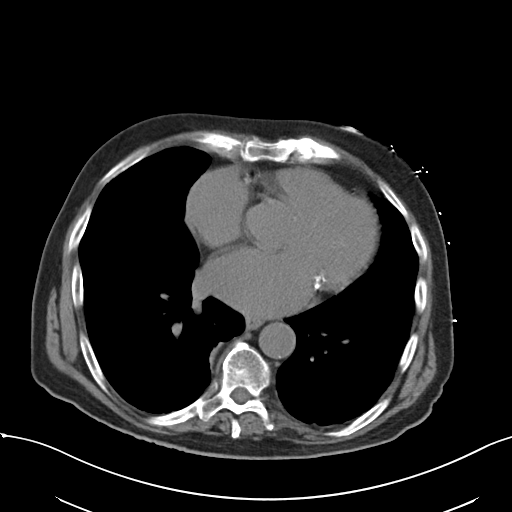

[Series 5: coronals · coronal · 0.70mm/px · 3 of 152 slices shown]
[im 51/152  soft-tissue]
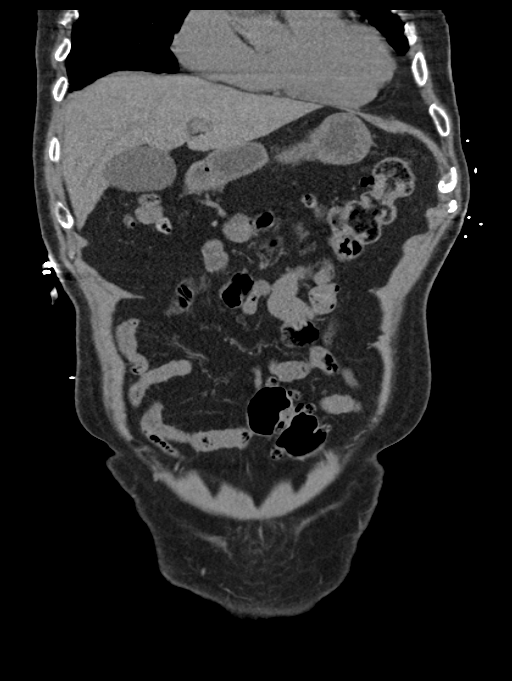
[im 68/152  soft-tissue]
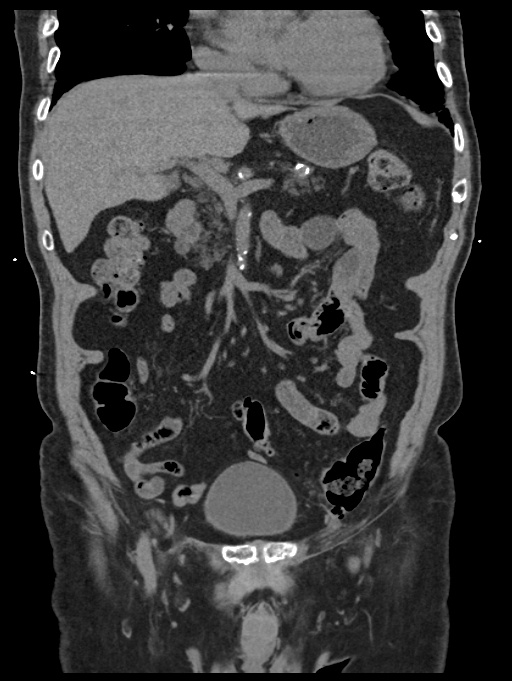
[im 84/152  soft-tissue]
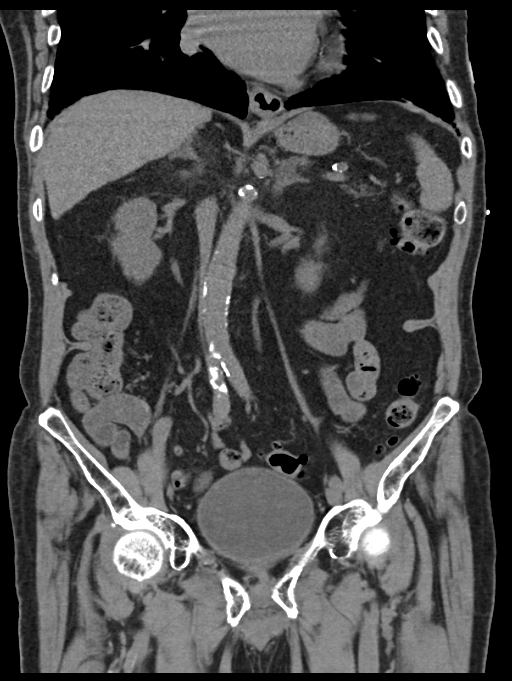

[15 of 46 positions shown; findings below may reference images not displayed]

FINDINGS: Mildly displaced fractures of left posterior eleventh and twelfth
ribs. Minimal pleural thickening at the left lung base, no
pneumothorax. Cardiomegaly and coronary artery calcifications. No
pleural effusion or parenchymal consolidation. Minimal reticular
opacities anteriorly.

Subcutaneous soft tissue edema involving the left flank with
thickening of the posterior lateral abdominal wall musculature. No
focal hematoma.

Evaluation for intra-abdominal injury is limited given lack of
contrast. Allowing for this, no evidence of acute traumatic injury
to the spleen, pancreas, adrenal glands, liver, gallbladder, or
kidneys. There is an 11 mm hypodensity in the mid spleen.
Nonobstructing calculus in the mid left kidney. Small parenchymal
calcifications in the mid left kidney. No hydronephrosis. Mild left
adrenal nodularity.

Small hiatal hernia. There are no dilated or thickened bowel loops.
Diverticulosis without diverticulitis. No mesenteric hematoma. No
free air, free fluid, or intra-abdominal fluid collection.

Normal caliber atherosclerotic abdominal aorta which is tortuous. No
retroperitoneal free fluid.

In the pelvis the urinary bladder is physiologically distended.
There are prostate calcifications. Fat in the left inguinal canal.
No pelvic free fluid.

No pelvic fracture. Scoliotic curvature and multilevel degenerative
change in the lumbar spine, no lumbar spine fracture.
IMPRESSION: 1. Mildly displaced fractures posterior left eleventh and twelfth
ribs. Minimal left pleural thickening without large pleural effusion
or pneumothorax.
2. Subcutaneous soft tissue edema of the left flank with adjacent
thickening/soft tissue injury to the posterior abdominal/flank wall
musculature.
3. No evidence of intra-abdominal/pelvic traumatic injury allowing
for lack of contrast.
4. Round 11 mm hypodensity in the mid spleen. This does not appear
traumatic and is likely an incidental benign finding.
5. Incidental findings of diverticulosis without diverticulitis,
small hiatal hernia, and atherosclerosis.

## 2016-09-30 DEATH — deceased
